# Patient Record
Sex: Female | Born: 1950
Health system: Southern US, Community
[De-identification: ages and names within clinical notes are randomized; demographics above are authoritative.]

## PROBLEM LIST (undated history)

## (undated) DIAGNOSIS — I10 Essential (primary) hypertension: Secondary | ICD-10-CM

## (undated) DIAGNOSIS — E785 Hyperlipidemia, unspecified: Secondary | ICD-10-CM

---

## 2017-09-16 DIAGNOSIS — E559 Vitamin D deficiency, unspecified: Secondary | ICD-10-CM | POA: Diagnosis not present

## 2017-09-16 DIAGNOSIS — J302 Other seasonal allergic rhinitis: Secondary | ICD-10-CM | POA: Insufficient documentation

## 2017-09-16 DIAGNOSIS — J3089 Other allergic rhinitis: Secondary | ICD-10-CM | POA: Diagnosis not present

## 2017-09-16 DIAGNOSIS — I10 Essential (primary) hypertension: Secondary | ICD-10-CM | POA: Diagnosis not present

## 2017-09-16 DIAGNOSIS — F17200 Nicotine dependence, unspecified, uncomplicated: Secondary | ICD-10-CM | POA: Diagnosis not present

## 2017-09-16 DIAGNOSIS — L2089 Other atopic dermatitis: Secondary | ICD-10-CM | POA: Insufficient documentation

## 2017-09-16 DIAGNOSIS — M5416 Radiculopathy, lumbar region: Secondary | ICD-10-CM | POA: Insufficient documentation

## 2017-10-01 DIAGNOSIS — I10 Essential (primary) hypertension: Secondary | ICD-10-CM | POA: Diagnosis not present

## 2017-10-19 DIAGNOSIS — I10 Essential (primary) hypertension: Secondary | ICD-10-CM | POA: Diagnosis not present

## 2017-10-26 DIAGNOSIS — I10 Essential (primary) hypertension: Secondary | ICD-10-CM | POA: Diagnosis not present

## 2017-10-26 DIAGNOSIS — F17200 Nicotine dependence, unspecified, uncomplicated: Secondary | ICD-10-CM | POA: Diagnosis not present

## 2017-10-26 DIAGNOSIS — E7800 Pure hypercholesterolemia, unspecified: Secondary | ICD-10-CM | POA: Diagnosis not present

## 2017-10-31 DIAGNOSIS — E7800 Pure hypercholesterolemia, unspecified: Secondary | ICD-10-CM | POA: Insufficient documentation

## 2018-04-20 DIAGNOSIS — E7800 Pure hypercholesterolemia, unspecified: Secondary | ICD-10-CM | POA: Diagnosis not present

## 2018-04-27 DIAGNOSIS — I10 Essential (primary) hypertension: Secondary | ICD-10-CM | POA: Diagnosis not present

## 2018-04-27 DIAGNOSIS — E7800 Pure hypercholesterolemia, unspecified: Secondary | ICD-10-CM | POA: Diagnosis not present

## 2018-04-27 DIAGNOSIS — Z780 Asymptomatic menopausal state: Secondary | ICD-10-CM | POA: Diagnosis not present

## 2018-04-27 DIAGNOSIS — Z1231 Encounter for screening mammogram for malignant neoplasm of breast: Secondary | ICD-10-CM | POA: Diagnosis not present

## 2018-04-27 DIAGNOSIS — Z0000 Encounter for general adult medical examination without abnormal findings: Secondary | ICD-10-CM | POA: Diagnosis not present

## 2018-04-28 ENCOUNTER — Other Ambulatory Visit: Payer: Self-pay | Admitting: Internal Medicine

## 2018-04-28 DIAGNOSIS — Z1231 Encounter for screening mammogram for malignant neoplasm of breast: Secondary | ICD-10-CM

## 2018-05-05 DIAGNOSIS — Z780 Asymptomatic menopausal state: Secondary | ICD-10-CM | POA: Diagnosis not present

## 2018-05-17 ENCOUNTER — Ambulatory Visit
Admission: RE | Admit: 2018-05-17 | Discharge: 2018-05-17 | Disposition: A | Discharge status: Home / Self Care | Payer: PPO | Source: Ambulatory Visit | Attending: Internal Medicine | Admitting: Internal Medicine

## 2018-05-17 DIAGNOSIS — Z1231 Encounter for screening mammogram for malignant neoplasm of breast: Secondary | ICD-10-CM | POA: Diagnosis not present

## 2018-06-02 ENCOUNTER — Other Ambulatory Visit: Payer: Self-pay | Admitting: *Deleted

## 2018-06-02 ENCOUNTER — Inpatient Hospital Stay
Admission: RE | Admit: 2018-06-02 | Discharge: 2018-06-02 | Disposition: A | Discharge status: Home / Self Care | Payer: Self-pay | Source: Ambulatory Visit | Attending: *Deleted | Admitting: *Deleted

## 2018-06-02 DIAGNOSIS — Z9289 Personal history of other medical treatment: Secondary | ICD-10-CM

## 2018-07-07 DIAGNOSIS — H1032 Unspecified acute conjunctivitis, left eye: Secondary | ICD-10-CM | POA: Diagnosis not present

## 2018-08-01 DIAGNOSIS — I10 Essential (primary) hypertension: Secondary | ICD-10-CM | POA: Diagnosis not present

## 2018-08-01 DIAGNOSIS — L2089 Other atopic dermatitis: Secondary | ICD-10-CM | POA: Diagnosis not present

## 2018-11-02 DIAGNOSIS — I10 Essential (primary) hypertension: Secondary | ICD-10-CM | POA: Diagnosis not present

## 2018-11-02 DIAGNOSIS — E559 Vitamin D deficiency, unspecified: Secondary | ICD-10-CM | POA: Diagnosis not present

## 2018-12-25 DIAGNOSIS — J029 Acute pharyngitis, unspecified: Secondary | ICD-10-CM | POA: Diagnosis not present

## 2018-12-25 DIAGNOSIS — R6889 Other general symptoms and signs: Secondary | ICD-10-CM | POA: Diagnosis not present

## 2018-12-25 DIAGNOSIS — J0100 Acute maxillary sinusitis, unspecified: Secondary | ICD-10-CM | POA: Diagnosis not present

## 2019-04-25 DIAGNOSIS — I10 Essential (primary) hypertension: Secondary | ICD-10-CM | POA: Diagnosis not present

## 2019-04-25 DIAGNOSIS — E559 Vitamin D deficiency, unspecified: Secondary | ICD-10-CM | POA: Diagnosis not present

## 2019-05-02 DIAGNOSIS — Z7982 Long term (current) use of aspirin: Secondary | ICD-10-CM | POA: Diagnosis not present

## 2019-05-02 DIAGNOSIS — I10 Essential (primary) hypertension: Secondary | ICD-10-CM | POA: Diagnosis not present

## 2019-05-02 DIAGNOSIS — Z1211 Encounter for screening for malignant neoplasm of colon: Secondary | ICD-10-CM | POA: Diagnosis not present

## 2019-05-02 DIAGNOSIS — Z0000 Encounter for general adult medical examination without abnormal findings: Secondary | ICD-10-CM | POA: Diagnosis not present

## 2019-05-02 DIAGNOSIS — M5416 Radiculopathy, lumbar region: Secondary | ICD-10-CM | POA: Diagnosis not present

## 2019-05-02 DIAGNOSIS — Z1159 Encounter for screening for other viral diseases: Secondary | ICD-10-CM | POA: Diagnosis not present

## 2019-05-02 DIAGNOSIS — R739 Hyperglycemia, unspecified: Secondary | ICD-10-CM | POA: Diagnosis not present

## 2019-05-02 DIAGNOSIS — D72828 Other elevated white blood cell count: Secondary | ICD-10-CM | POA: Diagnosis not present

## 2019-05-02 DIAGNOSIS — E559 Vitamin D deficiency, unspecified: Secondary | ICD-10-CM | POA: Diagnosis not present

## 2019-05-02 DIAGNOSIS — E7800 Pure hypercholesterolemia, unspecified: Secondary | ICD-10-CM | POA: Diagnosis not present

## 2019-05-12 DIAGNOSIS — Z1211 Encounter for screening for malignant neoplasm of colon: Secondary | ICD-10-CM | POA: Diagnosis not present

## 2019-05-25 DIAGNOSIS — R195 Other fecal abnormalities: Secondary | ICD-10-CM | POA: Diagnosis not present

## 2019-06-09 ENCOUNTER — Other Ambulatory Visit: Payer: Self-pay

## 2019-06-09 ENCOUNTER — Other Ambulatory Visit
Admission: RE | Admit: 2019-06-09 | Discharge: 2019-06-09 | Disposition: A | Discharge status: Home / Self Care | Payer: PPO | Source: Ambulatory Visit | Attending: Internal Medicine | Admitting: Internal Medicine

## 2019-06-09 DIAGNOSIS — Z1159 Encounter for screening for other viral diseases: Secondary | ICD-10-CM | POA: Diagnosis not present

## 2019-06-10 LAB — NOVEL CORONAVIRUS, NAA (HOSP ORDER, SEND-OUT TO REF LAB; TAT 18-24 HRS): SARS-CoV-2, NAA: NOT DETECTED

## 2019-06-13 ENCOUNTER — Encounter: Payer: Self-pay | Admitting: *Deleted

## 2019-06-14 ENCOUNTER — Ambulatory Visit
Admission: RE | Admit: 2019-06-14 | Discharge: 2019-06-14 | Disposition: A | Discharge status: Home / Self Care | Payer: PPO | Attending: Internal Medicine | Admitting: Internal Medicine

## 2019-06-14 ENCOUNTER — Ambulatory Visit: Payer: PPO | Admitting: Anesthesiology

## 2019-06-14 ENCOUNTER — Encounter
Admission: RE | Disposition: A | Discharge status: Home / Self Care | Payer: Self-pay | Source: Home / Self Care | Attending: Internal Medicine

## 2019-06-14 ENCOUNTER — Encounter: Payer: Self-pay | Admitting: *Deleted

## 2019-06-14 DIAGNOSIS — R195 Other fecal abnormalities: Secondary | ICD-10-CM | POA: Diagnosis not present

## 2019-06-14 DIAGNOSIS — K269 Duodenal ulcer, unspecified as acute or chronic, without hemorrhage or perforation: Secondary | ICD-10-CM | POA: Diagnosis not present

## 2019-06-14 DIAGNOSIS — D127 Benign neoplasm of rectosigmoid junction: Secondary | ICD-10-CM | POA: Insufficient documentation

## 2019-06-14 DIAGNOSIS — K640 First degree hemorrhoids: Secondary | ICD-10-CM | POA: Diagnosis not present

## 2019-06-14 DIAGNOSIS — Z7982 Long term (current) use of aspirin: Secondary | ICD-10-CM | POA: Diagnosis not present

## 2019-06-14 DIAGNOSIS — D125 Benign neoplasm of sigmoid colon: Secondary | ICD-10-CM | POA: Insufficient documentation

## 2019-06-14 DIAGNOSIS — I10 Essential (primary) hypertension: Secondary | ICD-10-CM | POA: Diagnosis not present

## 2019-06-14 DIAGNOSIS — K921 Melena: Secondary | ICD-10-CM | POA: Insufficient documentation

## 2019-06-14 DIAGNOSIS — Z79899 Other long term (current) drug therapy: Secondary | ICD-10-CM | POA: Diagnosis not present

## 2019-06-14 DIAGNOSIS — K264 Chronic or unspecified duodenal ulcer with hemorrhage: Secondary | ICD-10-CM | POA: Diagnosis not present

## 2019-06-14 DIAGNOSIS — J449 Chronic obstructive pulmonary disease, unspecified: Secondary | ICD-10-CM | POA: Diagnosis not present

## 2019-06-14 DIAGNOSIS — K635 Polyp of colon: Secondary | ICD-10-CM | POA: Diagnosis not present

## 2019-06-14 DIAGNOSIS — K3189 Other diseases of stomach and duodenum: Secondary | ICD-10-CM | POA: Diagnosis not present

## 2019-06-14 DIAGNOSIS — K2970 Gastritis, unspecified, without bleeding: Secondary | ICD-10-CM | POA: Diagnosis not present

## 2019-06-14 DIAGNOSIS — K648 Other hemorrhoids: Secondary | ICD-10-CM | POA: Diagnosis not present

## 2019-06-14 DIAGNOSIS — E785 Hyperlipidemia, unspecified: Secondary | ICD-10-CM | POA: Insufficient documentation

## 2019-06-14 DIAGNOSIS — K319 Disease of stomach and duodenum, unspecified: Secondary | ICD-10-CM | POA: Insufficient documentation

## 2019-06-14 DIAGNOSIS — F17200 Nicotine dependence, unspecified, uncomplicated: Secondary | ICD-10-CM | POA: Diagnosis not present

## 2019-06-14 DIAGNOSIS — D126 Benign neoplasm of colon, unspecified: Secondary | ICD-10-CM | POA: Diagnosis not present

## 2019-06-14 DIAGNOSIS — K2960 Other gastritis without bleeding: Secondary | ICD-10-CM | POA: Diagnosis not present

## 2019-06-14 DIAGNOSIS — D128 Benign neoplasm of rectum: Secondary | ICD-10-CM | POA: Diagnosis not present

## 2019-06-14 HISTORY — DX: Essential (primary) hypertension: I10

## 2019-06-14 HISTORY — PX: COLONOSCOPY WITH PROPOFOL: SHX5780

## 2019-06-14 HISTORY — DX: Hyperlipidemia, unspecified: E78.5

## 2019-06-14 HISTORY — PX: ESOPHAGOGASTRODUODENOSCOPY (EGD) WITH PROPOFOL: SHX5813

## 2019-06-14 SURGERY — COLONOSCOPY WITH PROPOFOL
Anesthesia: General

## 2019-06-14 MED ORDER — PROPOFOL 10 MG/ML IV BOLUS
INTRAVENOUS | Status: DC | PRN
  Administered 2019-06-14: 60 mg via INTRAVENOUS

## 2019-06-14 MED ORDER — PROPOFOL 10 MG/ML IV BOLUS
INTRAVENOUS | Status: AC
  Filled 2019-06-14: qty 20

## 2019-06-14 MED ORDER — PROPOFOL 500 MG/50ML IV EMUL
INTRAVENOUS | Status: AC
  Filled 2019-06-14: qty 50

## 2019-06-14 MED ORDER — EPINEPHRINE PF 1 MG/ML IJ SOLN
INTRAMUSCULAR | Status: DC | PRN
  Administered 2019-06-14: 1 mL via SUBCUTANEOUS

## 2019-06-14 MED ORDER — SODIUM CHLORIDE 0.9 % IV SOLN
INTRAVENOUS | Status: DC
  Administered 2019-06-14: 09:00:00 via INTRAVENOUS

## 2019-06-14 MED ORDER — PROPOFOL 500 MG/50ML IV EMUL
INTRAVENOUS | Status: DC | PRN
  Administered 2019-06-14: 150 ug/kg/min via INTRAVENOUS

## 2019-06-14 MED ORDER — GLYCOPYRROLATE 0.2 MG/ML IJ SOLN
INTRAMUSCULAR | Status: DC | PRN
  Administered 2019-06-14: 0.1 mg via INTRAVENOUS

## 2019-06-14 NOTE — Transfer of Care (Signed)
Immediate Anesthesia Transfer of Care Note  Patient: Brandy Holt  Procedure(s) Performed: COLONOSCOPY WITH PROPOFOL (N/A ) ESOPHAGOGASTRODUODENOSCOPY (EGD) WITH PROPOFOL (N/A )  Patient Location: PACU  Anesthesia Type:General  Level of Consciousness: drowsy  Airway & Oxygen Therapy: Patient Spontanous Breathing and Patient connected to nasal cannula oxygen  Post-op Assessment: Report given to RN and Post -op Vital signs reviewed and stable  Post vital signs: Reviewed and stable  Last Vitals:  Vitals Value Taken Time  BP 92/56 06/14/19 1028  Temp    Pulse 65 06/14/19 1029  Resp 14 06/14/19 1029  SpO2 100 % 06/14/19 1029  Vitals shown include unvalidated device data.  Last Pain:  Vitals:   06/14/19 1020  TempSrc: Tympanic  PainSc:          Complications: No apparent anesthesia complications

## 2019-06-14 NOTE — Anesthesia Preprocedure Evaluation (Signed)
Anesthesia Evaluation  Patient identified by MRN, date of birth, ID band Patient awake    Reviewed: Allergy & Precautions, H&P , NPO status , Patient's Chart, lab work & pertinent test results  History of Anesthesia Complications Negative for: history of anesthetic complications  Airway Mallampati: III  TM Distance: >3 FB Neck ROM: limited    Dental  (+) Chipped, Poor Dentition, Caps   Pulmonary COPD, Current Smoker,           Cardiovascular Exercise Tolerance: Good hypertension, (-) angina(-) Past MI and (-) DOE      Neuro/Psych negative neurological ROS  negative psych ROS   GI/Hepatic negative GI ROS, Neg liver ROS, neg GERD  ,  Endo/Other  negative endocrine ROS  Renal/GU negative Renal ROS  negative genitourinary   Musculoskeletal   Abdominal   Peds  Hematology negative hematology ROS (+)   Anesthesia Other Findings Past Medical History: No date: Hyperlipidemia No date: Hypertension  History reviewed. No pertinent surgical history.  BMI    Body Mass Index: 19.94 kg/m      Reproductive/Obstetrics negative OB ROS                             Anesthesia Physical Anesthesia Plan  ASA: III  Anesthesia Plan: General   Post-op Pain Management:    Induction: Intravenous  PONV Risk Score and Plan: Propofol infusion and TIVA  Airway Management Planned: Natural Airway and Nasal Cannula  Additional Equipment:   Intra-op Plan:   Post-operative Plan:   Informed Consent: I have reviewed the patients History and Physical, chart, labs and discussed the procedure including the risks, benefits and alternatives for the proposed anesthesia with the patient or authorized representative who has indicated his/her understanding and acceptance.     Dental Advisory Given  Plan Discussed with: Anesthesiologist, CRNA and Surgeon  Anesthesia Plan Comments: (Patient consented for risks  of anesthesia including but not limited to:  - adverse reactions to medications - risk of intubation if required - damage to teeth, lips or other oral mucosa - sore throat or hoarseness - Damage to heart, brain, lungs or loss of life  Patient voiced understanding.)        Anesthesia Quick Evaluation

## 2019-06-14 NOTE — Op Note (Signed)
[facility] Gastroenterology Patient Name: Brandy Holt Procedure Date: 06/14/2019 9:21 AM MRN: [id number] Account #: [id number] Date of Birth: 1951/06/29 Admit Type: Outpatient Age: 68 Room: [facility] ENDO ROOM 3 Gender: Female Note Status: Finalized Procedure:            Colonoscopy Indications:          Heme positive stool Providers:            Benay Pike. Jesse Hirst MD, MD Medicines:            Propofol per Anesthesia Complications:        No immediate complications. Procedure:            Pre-Anesthesia Assessment:                       - The risks and benefits of the procedure and the                        sedation options and risks were discussed with the                        patient. All questions were answered and informed                        consent was obtained.                       - Patient identification and proposed procedure were                        verified prior to the procedure by the nurse. The                        procedure was verified in the procedure room.                       - ASA Grade Assessment: III - A patient with severe                        systemic disease.                       - After reviewing the risks and benefits, the patient                        was deemed in satisfactory condition to undergo the                        procedure.                       After obtaining informed consent, the colonoscope was                        passed under direct vision. Throughout the procedure,                        the patient's blood pressure, pulse, and oxygen                        saturations were monitored continuously. The  Colonoscope was introduced through the anus and                        advanced to the the cecum, identified by appendiceal                        orifice and ileocecal valve. The colonoscopy was                        performed without difficulty. The patient tolerated the                  procedure well. The quality of the bowel preparation                        was excellent. The ileocecal valve, appendiceal                        orifice, and rectum were photographed. Findings:      The perianal and digital rectal examinations were normal. Pertinent       negatives include normal sphincter tone and no palpable rectal lesions.      Non-bleeding internal hemorrhoids were found during retroflexion. The       hemorrhoids were Grade I (internal hemorrhoids that do not prolapse).      A 5 mm polyp was found in the sigmoid colon. The polyp was sessile. The       polyp was removed with a hot snare. Resection and retrieval were       complete.      A 7 mm polyp was found in the recto-sigmoid colon. The polyp was       sessile. The polyp was removed with a hot snare. Resection and retrieval       were complete.      A 15 mm polyp was found in the recto-sigmoid colon. The polyp was       pedunculated. To prevent bleeding after the polypectomy, one hemostatic       clip was successfully placed (MR conditional). There was no bleeding       during, or at the end, of the procedure. The polyp was removed with a       hot snare. Resection and retrieval were complete. Impression:           - Non-bleeding internal hemorrhoids.                       - One 5 mm polyp in the sigmoid colon, removed with a                        hot snare. Resected and retrieved.                       - One 7 mm polyp at the recto-sigmoid colon, removed                        with a hot snare. Resected and retrieved.                       - One 15 mm polyp at the recto-sigmoid colon, removed  with a hot snare. Resected and retrieved. Clip (MR                        conditional) was placed. Recommendation:       - Await pathology results from EGD, also performed                        today.                       - PPI therapy with Pantoprazole 40mg  daily.                        - CBC in one day to monitor hgb in setting of ulcer                        bleed on EGD.                       - Await pathology results.                       - Repeat colonoscopy in 3 years for surveillance based                        on pathology results.                       - Return to physician assistant in 1 week.                       - The findings and recommendations were discussed with                        the patient. Procedure Code(s):    --- Professional ---                       [id number], Colonoscopy, flexible; with removal of tumor(s),                        polyp(s), or other lesion(s) by snare technique Diagnosis Code(s):    --- Professional ---                       R19.5, Other fecal abnormalities                       K64.0, First degree hemorrhoids                       K63.5, Polyp of colon CPT copyright 2019 American Medical Association. All rights reserved. The codes documented in this report are preliminary and upon coder review may  be revised to meet current compliance requirements. Efrain Sella MD, MD 06/14/2019 10:41:40 AM This report has been signed electronically. Number of Addenda: 0 Note Initiated On: 06/14/2019 9:21 AM Scope Withdrawal Time: 0 hours 12 minutes 57 seconds  Total Procedure Duration: 0 hours 19 minutes 37 seconds  Estimated Blood Loss: Estimated blood loss: none.      [facility]

## 2019-06-14 NOTE — H&P (Signed)
Outpatient short stay form Pre-procedure 06/14/2019 8:49 AM Brandy Holt K. [name], M.D.  Primary Physician: Ramonita Lab, M.D.  Reason for visit:  Hemoccult positive stool.  History of present illness:  Patient presents with hemoccult positive stool. Has occasional loose stools, but nothing chronic. Denies hematochezia, abdominal pain.    No current facility-administered medications for this encounter.   Medications Prior to Admission  Medication Sig Dispense Refill Last Dose  . amLODipine (NORVASC) 10 MG tablet Take 10 mg by mouth daily.     Marland Kitchen aspirin 325 MG tablet Take 325 mg by mouth daily.     . Calcium Carbonate-Vitamin D 600-400 MG-UNIT tablet Take 1 tablet by mouth daily.     Marland Kitchen lisinopril (ZESTRIL) 5 MG tablet Take 5 mg by mouth daily.     Marland Kitchen terbinafine (LAMISIL) 1 % cream Apply 1 application topically 2 (two) times daily.     Marland Kitchen triamcinolone cream (KENALOG) 0.1 % Apply 1 application topically 2 (two) times daily.        Not on File   Past Medical History:  Diagnosis Date  . Hyperlipidemia   . Hypertension     Review of systems:  Otherwise negative.    Physical Exam  Gen: Alert, oriented. Appears stated age.  HEENT: [place]/AT. PERRLA. Lungs: CTA, no wheezes. CV: RR nl S1, S2. Abd: soft, benign, no masses. BS+ Ext: No edema. Pulses 2+    Planned procedures: Proceed with colonoscopy. The patient understands the nature of the planned procedure, indications, risks, alternatives and potential complications including but not limited to bleeding, infection, perforation, damage to internal organs and possible oversedation/side effects from anesthesia. The patient agrees and gives consent to proceed.  Please refer to procedure notes for findings, recommendations and patient disposition/instructions.     Brandy Holt K. [name], M.D. Gastroenterology 06/14/2019  8:49 AM

## 2019-06-14 NOTE — Op Note (Signed)
[facility] Gastroenterology Patient Name: Brandy Holt Procedure Date: 06/14/2019 9:21 AM MRN: [id number] Account #: [id number] Date of Birth: 28-May-1951 Admit Type: Outpatient Age: 68 Room: [facility] ENDO ROOM 3 Gender: Female Note Status: Finalized Procedure:            Upper GI endoscopy Indications:          Heme positive stool Providers:            Benay Pike. Radin Raptis MD, MD Medicines:            Propofol per Anesthesia Complications:        No immediate complications. Procedure:            Pre-Anesthesia Assessment:                       - The risks and benefits of the procedure and the                        sedation options and risks were discussed with the                        patient. All questions were answered and informed                        consent was obtained.                       - Patient identification and proposed procedure were                        verified prior to the procedure by the nurse. The                        procedure was verified in the procedure room.                       - ASA Grade Assessment: III - A patient with severe                        systemic disease.                       - After reviewing the risks and benefits, the patient                        was deemed in satisfactory condition to undergo the                        procedure.                       After obtaining informed consent, the endoscope was                        passed under direct vision. Throughout the procedure,                        the patient's blood pressure, pulse, and oxygen                        saturations were monitored continuously. The Endoscope  was introduced through the mouth, and advanced to the                        third part of duodenum. The upper GI endoscopy was                        accomplished without difficulty. The patient tolerated                        the procedure well. Findings:  The examined esophagus was normal.      Localized mild inflammation characterized by erosions was found in the       gastric antrum. Biopsies were taken with a cold forceps for Helicobacter       pylori testing.      One non-obstructing oozing linear duodenal ulcer with a visible vessel       was found in the first portion of the duodenum. The lesion was 6 mm in       largest dimension. There is no evidence of perforation. Area was       unsuccessfully injected with 1 mL of a 1:10,000 solution of epinephrine       for hemostasis. Coagulation for hemostasis using bipolar probe was       successful.      The cardia and gastric fundus were normal on retroflexion.      The exam was otherwise without abnormality. Impression:           - Normal esophagus.                       - Gastritis. Biopsied.                       - Non-obstructing oozing duodenal ulcer with a visible                        vessel. There is no evidence of perforation. Treatment                        not successful. Treated with bipolar cautery.                       - The examination was otherwise normal. Recommendation:       - Await pathology results.                       - Proceed with colonoscopy Procedure Code(s):    --- Professional ---                       63845, 70, Esophagogastroduodenoscopy, flexible,                        transoral; with control of bleeding, any method                       43239, Esophagogastroduodenoscopy, flexible, transoral;                        with biopsy, single or multiple Diagnosis Code(s):    --- Professional ---  R19.5, Other fecal abnormalities                       K26.4, Chronic or unspecified duodenal ulcer with                        hemorrhage                       K29.70, Gastritis, unspecified, without bleeding CPT copyright 2019 American Medical Association. All rights reserved. The codes documented in this report are preliminary and upon coder  review may  be revised to meet current compliance requirements. Efrain Sella MD, MD 06/14/2019 10:01:04 AM This report has been signed electronically. Number of Addenda: 0 Note Initiated On: 06/14/2019 9:21 AM Estimated Blood Loss: Estimated blood loss was minimal.      [facility]

## 2019-06-14 NOTE — Anesthesia Postprocedure Evaluation (Signed)
Anesthesia Post Note  Patient: Brandy Holt  Procedure(s) Performed: COLONOSCOPY WITH PROPOFOL (N/A ) ESOPHAGOGASTRODUODENOSCOPY (EGD) WITH PROPOFOL (N/A )  Patient location during evaluation: Endoscopy Anesthesia Type: General Level of consciousness: awake and alert Pain management: pain level controlled Vital Signs Assessment: post-procedure vital signs reviewed and stable Respiratory status: spontaneous breathing, nonlabored ventilation, respiratory function stable and patient connected to nasal cannula oxygen Cardiovascular status: blood pressure returned to baseline and stable Postop Assessment: no apparent nausea or vomiting Anesthetic complications: no     Last Vitals:  Vitals:   06/14/19 1050 06/14/19 1100  BP: 103/73 97/72  Pulse: 61 (!) 53  Resp: (!) 21 16  Temp:    SpO2: 100% 100%    Last Pain:  Vitals:   06/14/19 1020  TempSrc: Tympanic  PainSc:                  [name]

## 2019-06-14 NOTE — Interval H&P Note (Signed)
History and Physical Interval Note:  06/14/2019 8:52 AM  Brandy Holt  has presented today for surgery, with the diagnosis of HEME + STOOL.  The various methods of treatment have been discussed with the patient and family. After consideration of risks, benefits and other options for treatment, the patient has consented to  Procedure(s): COLONOSCOPY WITH PROPOFOL (N/A) ESOPHAGOGASTRODUODENOSCOPY (EGD) WITH PROPOFOL (N/A) as a surgical intervention.  The patient's history has been reviewed, patient examined, no change in status, stable for surgery.  I have reviewed the patient's chart and labs.  Questions were answered to the patient's satisfaction.     [place], [place]

## 2019-06-14 NOTE — Anesthesia Post-op Follow-up Note (Signed)
Anesthesia QCDR form completed.        

## 2019-06-15 ENCOUNTER — Encounter: Payer: Self-pay | Admitting: Internal Medicine

## 2019-06-15 DIAGNOSIS — K274 Chronic or unspecified peptic ulcer, site unspecified, with hemorrhage: Secondary | ICD-10-CM | POA: Diagnosis not present

## 2019-06-15 LAB — SURGICAL PATHOLOGY

## 2019-06-21 DIAGNOSIS — K269 Duodenal ulcer, unspecified as acute or chronic, without hemorrhage or perforation: Secondary | ICD-10-CM | POA: Diagnosis not present

## 2019-06-21 DIAGNOSIS — M5416 Radiculopathy, lumbar region: Secondary | ICD-10-CM | POA: Diagnosis not present

## 2019-06-21 DIAGNOSIS — K2970 Gastritis, unspecified, without bleeding: Secondary | ICD-10-CM | POA: Diagnosis not present

## 2019-06-21 DIAGNOSIS — F17200 Nicotine dependence, unspecified, uncomplicated: Secondary | ICD-10-CM | POA: Diagnosis not present

## 2019-06-21 DIAGNOSIS — D369 Benign neoplasm, unspecified site: Secondary | ICD-10-CM | POA: Diagnosis not present

## 2019-06-21 DIAGNOSIS — I10 Essential (primary) hypertension: Secondary | ICD-10-CM | POA: Diagnosis not present

## 2019-10-26 DIAGNOSIS — E7800 Pure hypercholesterolemia, unspecified: Secondary | ICD-10-CM | POA: Diagnosis not present

## 2019-10-26 DIAGNOSIS — Z7982 Long term (current) use of aspirin: Secondary | ICD-10-CM | POA: Diagnosis not present

## 2019-10-26 DIAGNOSIS — E559 Vitamin D deficiency, unspecified: Secondary | ICD-10-CM | POA: Diagnosis not present

## 2019-10-26 DIAGNOSIS — I10 Essential (primary) hypertension: Secondary | ICD-10-CM | POA: Diagnosis not present

## 2019-10-26 DIAGNOSIS — R739 Hyperglycemia, unspecified: Secondary | ICD-10-CM | POA: Diagnosis not present

## 2019-10-26 DIAGNOSIS — M5416 Radiculopathy, lumbar region: Secondary | ICD-10-CM | POA: Diagnosis not present

## 2019-11-02 DIAGNOSIS — E7800 Pure hypercholesterolemia, unspecified: Secondary | ICD-10-CM | POA: Diagnosis not present

## 2019-11-02 DIAGNOSIS — I10 Essential (primary) hypertension: Secondary | ICD-10-CM | POA: Diagnosis not present

## 2019-11-02 DIAGNOSIS — M5416 Radiculopathy, lumbar region: Secondary | ICD-10-CM | POA: Diagnosis not present

## 2019-11-02 DIAGNOSIS — R739 Hyperglycemia, unspecified: Secondary | ICD-10-CM | POA: Diagnosis not present

## 2019-11-02 DIAGNOSIS — E559 Vitamin D deficiency, unspecified: Secondary | ICD-10-CM | POA: Diagnosis not present

## 2019-12-25 DIAGNOSIS — Z791 Long term (current) use of non-steroidal anti-inflammatories (NSAID): Secondary | ICD-10-CM | POA: Diagnosis not present

## 2019-12-25 DIAGNOSIS — K269 Duodenal ulcer, unspecified as acute or chronic, without hemorrhage or perforation: Secondary | ICD-10-CM | POA: Diagnosis not present

## 2019-12-25 DIAGNOSIS — D369 Benign neoplasm, unspecified site: Secondary | ICD-10-CM | POA: Diagnosis not present

## 2019-12-25 DIAGNOSIS — K2970 Gastritis, unspecified, without bleeding: Secondary | ICD-10-CM | POA: Diagnosis not present

## 2019-12-25 DIAGNOSIS — F17200 Nicotine dependence, unspecified, uncomplicated: Secondary | ICD-10-CM | POA: Diagnosis not present

## 2020-02-26 ENCOUNTER — Other Ambulatory Visit: Payer: Self-pay | Admitting: Internal Medicine

## 2020-02-26 DIAGNOSIS — Z1231 Encounter for screening mammogram for malignant neoplasm of breast: Secondary | ICD-10-CM

## 2020-04-09 ENCOUNTER — Ambulatory Visit
Admission: RE | Admit: 2020-04-09 | Discharge: 2020-04-09 | Disposition: A | Discharge status: Home / Self Care | Payer: PPO | Source: Ambulatory Visit | Attending: Internal Medicine | Admitting: Internal Medicine

## 2020-04-09 DIAGNOSIS — Z1231 Encounter for screening mammogram for malignant neoplasm of breast: Secondary | ICD-10-CM | POA: Insufficient documentation

## 2020-04-15 ENCOUNTER — Other Ambulatory Visit: Payer: Self-pay | Admitting: Internal Medicine

## 2020-04-15 DIAGNOSIS — N6489 Other specified disorders of breast: Secondary | ICD-10-CM

## 2020-04-15 DIAGNOSIS — R928 Other abnormal and inconclusive findings on diagnostic imaging of breast: Secondary | ICD-10-CM

## 2020-04-19 ENCOUNTER — Ambulatory Visit
Admission: RE | Admit: 2020-04-19 | Discharge: 2020-04-19 | Disposition: A | Discharge status: Home / Self Care | Payer: PPO | Source: Ambulatory Visit | Attending: Internal Medicine | Admitting: Internal Medicine

## 2020-04-19 DIAGNOSIS — N6489 Other specified disorders of breast: Secondary | ICD-10-CM

## 2020-04-19 DIAGNOSIS — R928 Other abnormal and inconclusive findings on diagnostic imaging of breast: Secondary | ICD-10-CM

## 2020-04-19 DIAGNOSIS — R922 Inconclusive mammogram: Secondary | ICD-10-CM | POA: Diagnosis not present

## 2020-04-25 DIAGNOSIS — I10 Essential (primary) hypertension: Secondary | ICD-10-CM | POA: Diagnosis not present

## 2020-04-25 DIAGNOSIS — E7800 Pure hypercholesterolemia, unspecified: Secondary | ICD-10-CM | POA: Diagnosis not present

## 2020-04-25 DIAGNOSIS — E559 Vitamin D deficiency, unspecified: Secondary | ICD-10-CM | POA: Diagnosis not present

## 2020-04-25 DIAGNOSIS — R739 Hyperglycemia, unspecified: Secondary | ICD-10-CM | POA: Diagnosis not present

## 2020-04-25 DIAGNOSIS — M5416 Radiculopathy, lumbar region: Secondary | ICD-10-CM | POA: Diagnosis not present

## 2020-05-02 DIAGNOSIS — Z7982 Long term (current) use of aspirin: Secondary | ICD-10-CM | POA: Diagnosis not present

## 2020-05-02 DIAGNOSIS — M5416 Radiculopathy, lumbar region: Secondary | ICD-10-CM | POA: Diagnosis not present

## 2020-05-02 DIAGNOSIS — E559 Vitamin D deficiency, unspecified: Secondary | ICD-10-CM | POA: Diagnosis not present

## 2020-05-02 DIAGNOSIS — E7800 Pure hypercholesterolemia, unspecified: Secondary | ICD-10-CM | POA: Diagnosis not present

## 2020-05-02 DIAGNOSIS — I10 Essential (primary) hypertension: Secondary | ICD-10-CM | POA: Diagnosis not present

## 2020-05-02 DIAGNOSIS — Z0000 Encounter for general adult medical examination without abnormal findings: Secondary | ICD-10-CM | POA: Diagnosis not present

## 2020-06-04 DIAGNOSIS — E7800 Pure hypercholesterolemia, unspecified: Secondary | ICD-10-CM | POA: Diagnosis not present

## 2020-09-03 DIAGNOSIS — R21 Rash and other nonspecific skin eruption: Secondary | ICD-10-CM | POA: Diagnosis not present

## 2020-09-16 DIAGNOSIS — D2261 Melanocytic nevi of right upper limb, including shoulder: Secondary | ICD-10-CM | POA: Diagnosis not present

## 2020-09-16 DIAGNOSIS — L270 Generalized skin eruption due to drugs and medicaments taken internally: Secondary | ICD-10-CM | POA: Diagnosis not present

## 2020-09-16 DIAGNOSIS — L309 Dermatitis, unspecified: Secondary | ICD-10-CM | POA: Diagnosis not present

## 2020-09-16 DIAGNOSIS — D225 Melanocytic nevi of trunk: Secondary | ICD-10-CM | POA: Diagnosis not present

## 2020-09-16 DIAGNOSIS — L821 Other seborrheic keratosis: Secondary | ICD-10-CM | POA: Diagnosis not present

## 2020-09-16 DIAGNOSIS — D2272 Melanocytic nevi of left lower limb, including hip: Secondary | ICD-10-CM | POA: Diagnosis not present

## 2020-09-16 DIAGNOSIS — D2271 Melanocytic nevi of right lower limb, including hip: Secondary | ICD-10-CM | POA: Diagnosis not present

## 2020-09-16 DIAGNOSIS — D2262 Melanocytic nevi of left upper limb, including shoulder: Secondary | ICD-10-CM | POA: Diagnosis not present

## 2020-10-29 DIAGNOSIS — I10 Essential (primary) hypertension: Secondary | ICD-10-CM | POA: Diagnosis not present

## 2020-10-29 DIAGNOSIS — E7800 Pure hypercholesterolemia, unspecified: Secondary | ICD-10-CM | POA: Diagnosis not present

## 2020-10-29 DIAGNOSIS — M5416 Radiculopathy, lumbar region: Secondary | ICD-10-CM | POA: Diagnosis not present

## 2020-10-29 DIAGNOSIS — Z7982 Long term (current) use of aspirin: Secondary | ICD-10-CM | POA: Diagnosis not present

## 2020-11-05 DIAGNOSIS — E7800 Pure hypercholesterolemia, unspecified: Secondary | ICD-10-CM | POA: Diagnosis not present

## 2020-11-05 DIAGNOSIS — E559 Vitamin D deficiency, unspecified: Secondary | ICD-10-CM | POA: Diagnosis not present

## 2020-11-05 DIAGNOSIS — I10 Essential (primary) hypertension: Secondary | ICD-10-CM | POA: Diagnosis not present

## 2020-11-05 DIAGNOSIS — M5416 Radiculopathy, lumbar region: Secondary | ICD-10-CM | POA: Diagnosis not present

## 2020-12-24 DIAGNOSIS — Z86010 Personal history of colonic polyps: Secondary | ICD-10-CM | POA: Diagnosis not present

## 2020-12-24 DIAGNOSIS — K2970 Gastritis, unspecified, without bleeding: Secondary | ICD-10-CM | POA: Diagnosis not present

## 2020-12-24 DIAGNOSIS — Z791 Long term (current) use of non-steroidal anti-inflammatories (NSAID): Secondary | ICD-10-CM | POA: Diagnosis not present

## 2020-12-24 DIAGNOSIS — F17200 Nicotine dependence, unspecified, uncomplicated: Secondary | ICD-10-CM | POA: Diagnosis not present

## 2020-12-24 DIAGNOSIS — K269 Duodenal ulcer, unspecified as acute or chronic, without hemorrhage or perforation: Secondary | ICD-10-CM | POA: Diagnosis not present

## 2021-04-28 DIAGNOSIS — I10 Essential (primary) hypertension: Secondary | ICD-10-CM | POA: Diagnosis not present

## 2021-04-28 DIAGNOSIS — E7800 Pure hypercholesterolemia, unspecified: Secondary | ICD-10-CM | POA: Diagnosis not present

## 2021-04-28 DIAGNOSIS — M5416 Radiculopathy, lumbar region: Secondary | ICD-10-CM | POA: Diagnosis not present

## 2021-05-05 DIAGNOSIS — E7800 Pure hypercholesterolemia, unspecified: Secondary | ICD-10-CM | POA: Diagnosis not present

## 2021-05-05 DIAGNOSIS — E559 Vitamin D deficiency, unspecified: Secondary | ICD-10-CM | POA: Diagnosis not present

## 2021-05-05 DIAGNOSIS — Z0000 Encounter for general adult medical examination without abnormal findings: Secondary | ICD-10-CM | POA: Diagnosis not present

## 2021-05-05 DIAGNOSIS — M5416 Radiculopathy, lumbar region: Secondary | ICD-10-CM | POA: Diagnosis not present

## 2021-05-05 DIAGNOSIS — F17200 Nicotine dependence, unspecified, uncomplicated: Secondary | ICD-10-CM | POA: Diagnosis not present

## 2021-05-05 DIAGNOSIS — I10 Essential (primary) hypertension: Secondary | ICD-10-CM | POA: Diagnosis not present

## 2021-09-11 DIAGNOSIS — Z135 Encounter for screening for eye and ear disorders: Secondary | ICD-10-CM | POA: Diagnosis not present

## 2021-09-11 DIAGNOSIS — H524 Presbyopia: Secondary | ICD-10-CM | POA: Diagnosis not present

## 2021-09-11 DIAGNOSIS — H5213 Myopia, bilateral: Secondary | ICD-10-CM | POA: Diagnosis not present

## 2021-09-11 DIAGNOSIS — H2513 Age-related nuclear cataract, bilateral: Secondary | ICD-10-CM | POA: Diagnosis not present

## 2021-09-11 DIAGNOSIS — H52223 Regular astigmatism, bilateral: Secondary | ICD-10-CM | POA: Diagnosis not present

## 2021-10-29 DIAGNOSIS — E7800 Pure hypercholesterolemia, unspecified: Secondary | ICD-10-CM | POA: Diagnosis not present

## 2021-10-29 DIAGNOSIS — M5416 Radiculopathy, lumbar region: Secondary | ICD-10-CM | POA: Diagnosis not present

## 2021-10-29 DIAGNOSIS — I10 Essential (primary) hypertension: Secondary | ICD-10-CM | POA: Diagnosis not present

## 2021-11-05 DIAGNOSIS — E559 Vitamin D deficiency, unspecified: Secondary | ICD-10-CM | POA: Diagnosis not present

## 2021-11-05 DIAGNOSIS — R739 Hyperglycemia, unspecified: Secondary | ICD-10-CM | POA: Diagnosis not present

## 2021-11-05 DIAGNOSIS — E7800 Pure hypercholesterolemia, unspecified: Secondary | ICD-10-CM | POA: Diagnosis not present

## 2021-11-05 DIAGNOSIS — M5416 Radiculopathy, lumbar region: Secondary | ICD-10-CM | POA: Diagnosis not present

## 2021-11-05 DIAGNOSIS — I10 Essential (primary) hypertension: Secondary | ICD-10-CM | POA: Diagnosis not present

## 2022-03-10 DIAGNOSIS — H5213 Myopia, bilateral: Secondary | ICD-10-CM | POA: Diagnosis not present

## 2022-03-10 DIAGNOSIS — H524 Presbyopia: Secondary | ICD-10-CM | POA: Diagnosis not present

## 2022-03-10 DIAGNOSIS — H2513 Age-related nuclear cataract, bilateral: Secondary | ICD-10-CM | POA: Diagnosis not present

## 2022-03-10 DIAGNOSIS — H52223 Regular astigmatism, bilateral: Secondary | ICD-10-CM | POA: Diagnosis not present

## 2022-03-11 ENCOUNTER — Other Ambulatory Visit: Payer: Self-pay | Admitting: Internal Medicine

## 2022-03-11 DIAGNOSIS — Z1231 Encounter for screening mammogram for malignant neoplasm of breast: Secondary | ICD-10-CM

## 2022-04-17 ENCOUNTER — Ambulatory Visit
Admission: RE | Admit: 2022-04-17 | Discharge: 2022-04-17 | Disposition: A | Discharge status: Home / Self Care | Payer: PPO | Source: Ambulatory Visit | Attending: Internal Medicine | Admitting: Internal Medicine

## 2022-04-17 DIAGNOSIS — Z1231 Encounter for screening mammogram for malignant neoplasm of breast: Secondary | ICD-10-CM | POA: Diagnosis not present

## 2022-04-20 ENCOUNTER — Other Ambulatory Visit: Payer: Self-pay | Admitting: Internal Medicine

## 2022-04-20 DIAGNOSIS — R928 Other abnormal and inconclusive findings on diagnostic imaging of breast: Secondary | ICD-10-CM

## 2022-04-20 DIAGNOSIS — N63 Unspecified lump in unspecified breast: Secondary | ICD-10-CM

## 2022-04-29 DIAGNOSIS — E7800 Pure hypercholesterolemia, unspecified: Secondary | ICD-10-CM | POA: Diagnosis not present

## 2022-04-29 DIAGNOSIS — I10 Essential (primary) hypertension: Secondary | ICD-10-CM | POA: Diagnosis not present

## 2022-04-29 DIAGNOSIS — R739 Hyperglycemia, unspecified: Secondary | ICD-10-CM | POA: Diagnosis not present

## 2022-04-29 DIAGNOSIS — M5416 Radiculopathy, lumbar region: Secondary | ICD-10-CM | POA: Diagnosis not present

## 2022-04-29 DIAGNOSIS — E559 Vitamin D deficiency, unspecified: Secondary | ICD-10-CM | POA: Diagnosis not present

## 2022-05-07 ENCOUNTER — Ambulatory Visit
Admission: RE | Admit: 2022-05-07 | Discharge: 2022-05-07 | Disposition: A | Discharge status: Home / Self Care | Payer: PPO | Source: Ambulatory Visit | Attending: Internal Medicine | Admitting: Internal Medicine

## 2022-05-07 DIAGNOSIS — N63 Unspecified lump in unspecified breast: Secondary | ICD-10-CM

## 2022-05-07 DIAGNOSIS — R928 Other abnormal and inconclusive findings on diagnostic imaging of breast: Secondary | ICD-10-CM | POA: Insufficient documentation

## 2022-05-07 DIAGNOSIS — N6001 Solitary cyst of right breast: Secondary | ICD-10-CM | POA: Diagnosis not present

## 2022-05-07 DIAGNOSIS — R922 Inconclusive mammogram: Secondary | ICD-10-CM | POA: Diagnosis not present

## 2022-05-08 DIAGNOSIS — D72820 Lymphocytosis (symptomatic): Secondary | ICD-10-CM | POA: Diagnosis not present

## 2022-05-08 DIAGNOSIS — M5416 Radiculopathy, lumbar region: Secondary | ICD-10-CM | POA: Diagnosis not present

## 2022-05-08 DIAGNOSIS — F17210 Nicotine dependence, cigarettes, uncomplicated: Secondary | ICD-10-CM | POA: Diagnosis not present

## 2022-05-08 DIAGNOSIS — R0989 Other specified symptoms and signs involving the circulatory and respiratory systems: Secondary | ICD-10-CM | POA: Diagnosis not present

## 2022-05-08 DIAGNOSIS — Z0000 Encounter for general adult medical examination without abnormal findings: Secondary | ICD-10-CM | POA: Diagnosis not present

## 2022-05-08 DIAGNOSIS — E559 Vitamin D deficiency, unspecified: Secondary | ICD-10-CM | POA: Diagnosis not present

## 2022-05-08 DIAGNOSIS — I10 Essential (primary) hypertension: Secondary | ICD-10-CM | POA: Diagnosis not present

## 2022-05-08 DIAGNOSIS — E7800 Pure hypercholesterolemia, unspecified: Secondary | ICD-10-CM | POA: Diagnosis not present

## 2022-06-24 DIAGNOSIS — I6523 Occlusion and stenosis of bilateral carotid arteries: Secondary | ICD-10-CM | POA: Diagnosis not present

## 2022-06-24 DIAGNOSIS — R0989 Other specified symptoms and signs involving the circulatory and respiratory systems: Secondary | ICD-10-CM | POA: Diagnosis not present

## 2022-06-24 DIAGNOSIS — I6502 Occlusion and stenosis of left vertebral artery: Secondary | ICD-10-CM | POA: Diagnosis not present

## 2022-06-24 DIAGNOSIS — I771 Stricture of artery: Secondary | ICD-10-CM | POA: Diagnosis not present

## 2022-06-25 DIAGNOSIS — I6502 Occlusion and stenosis of left vertebral artery: Secondary | ICD-10-CM | POA: Insufficient documentation

## 2022-08-11 ENCOUNTER — Ambulatory Visit (INDEPENDENT_AMBULATORY_CARE_PROVIDER_SITE_OTHER): Payer: PPO | Admitting: Nurse Practitioner

## 2022-08-11 ENCOUNTER — Encounter (INDEPENDENT_AMBULATORY_CARE_PROVIDER_SITE_OTHER): Payer: PPO | Admitting: Nurse Practitioner

## 2022-08-11 ENCOUNTER — Encounter (INDEPENDENT_AMBULATORY_CARE_PROVIDER_SITE_OTHER): Payer: Self-pay | Admitting: Nurse Practitioner

## 2022-08-11 VITALS — BP 110/74 | HR 108 | Resp 18 | Ht 62.0 in | Wt 107.0 lb

## 2022-08-11 DIAGNOSIS — I10 Essential (primary) hypertension: Secondary | ICD-10-CM | POA: Diagnosis not present

## 2022-08-11 DIAGNOSIS — F17200 Nicotine dependence, unspecified, uncomplicated: Secondary | ICD-10-CM | POA: Diagnosis not present

## 2022-08-11 DIAGNOSIS — I6502 Occlusion and stenosis of left vertebral artery: Secondary | ICD-10-CM

## 2022-08-11 DIAGNOSIS — E7800 Pure hypercholesterolemia, unspecified: Secondary | ICD-10-CM | POA: Diagnosis not present

## 2022-08-11 MED ORDER — ROSUVASTATIN CALCIUM 5 MG PO TABS
5.0000 mg | ORAL_TABLET | ORAL | 5 refills | Status: DC
Start: 2022-08-12 — End: 2023-08-30

## 2022-08-30 ENCOUNTER — Encounter (INDEPENDENT_AMBULATORY_CARE_PROVIDER_SITE_OTHER): Payer: Self-pay | Admitting: Nurse Practitioner

## 2022-08-30 NOTE — Progress Notes (Signed)
Subjective:    Patient ID: Brandy Holt, female    DOB: Apr 08, 1951, 71 y.o.   MRN: [id number] Chief Complaint  Patient presents with   New Patient (Initial Visit)    Consult for left vertebral artery stenosis    The patient is a 71 year old female who presents today as a referral from her PCP Dr. Caryl Comes in regards to an abnormal carotid duplex.  She initially had a carotid bruit auscultated on exam.  The patient was sent for carotid artery duplex which showed some early evidence of left vertebral artery stenosis.  Patient currently denies any dizziness.  She denies any numbness tingling or pain with exercise or activity or use of her left upper extremity.  She denies any cold sensation in her left upper extremity.  The patient underwent blood pressure of 124/72 with a left of 106/70    Review of Systems  Neurological:  Negative for dizziness.  All other systems reviewed and are negative.      Objective:   Physical Exam Vitals reviewed.  HENT:     Head: Normocephalic.  Neck:     Vascular: Carotid bruit present.  Cardiovascular:     Rate and Rhythm: Normal rate and regular rhythm.     Pulses:          Radial pulses are 2+ on the right side and 2+ on the left side.  Pulmonary:     Effort: Pulmonary effort is normal.  Skin:    General: Skin is warm and dry.  Neurological:     Mental Status: She is alert and oriented to person, place, and time.  Psychiatric:        Mood and Affect: Mood normal.        Behavior: Behavior normal.        Thought Content: Thought content normal.        Judgment: Judgment normal.     BP 110/74 (BP Location: Left Arm)   Pulse (!) 108   Resp 18   Ht '5\' 2"'$  (1.575 m)   Wt 107 lb (48.5 kg)   BMI 19.57 kg/m   Past Medical History:  Diagnosis Date   Hyperlipidemia    Hypertension     Social History   Socioeconomic History   Marital status: Divorced    Spouse name: Not on file   Number of children: Not on file   Years of education:  Not on file   Highest education level: Not on file  Occupational History   Not on file  Tobacco Use   Smoking status: Every Day    Packs/day: 0.50    Types: Cigarettes   Smokeless tobacco: Never  Vaping Use   Vaping Use: Never used  Substance and Sexual Activity   Alcohol use: Never   Drug use: Never   Sexual activity: Not on file  Other Topics Concern   Not on file  Social History Narrative   Not on file   Social Determinants of Health   Financial Resource Strain: Not on file  Food Insecurity: Not on file  Transportation Needs: Not on file  Physical Activity: Not on file  Stress: Not on file  Social Connections: Not on file  Intimate Partner Violence: Not on file    Past Surgical History:  Procedure Laterality Date   COLONOSCOPY WITH PROPOFOL N/A 06/14/2019   Procedure: COLONOSCOPY WITH PROPOFOL;  Surgeon: Toledo, Benay Pike, MD;  Location: [facility] ENDOSCOPY;  Service: Gastroenterology;  Laterality: N/A;   ESOPHAGOGASTRODUODENOSCOPY (  EGD) WITH PROPOFOL N/A 06/14/2019   Procedure: ESOPHAGOGASTRODUODENOSCOPY (EGD) WITH PROPOFOL;  Surgeon: Toledo, Benay Pike, MD;  Location: [facility] ENDOSCOPY;  Service: Gastroenterology;  Laterality: N/A;    Family History  Adopted: Yes  Problem Relation Age of Onset   Breast cancer Neg Hx     Allergies  Allergen Reactions   Amoxicillin Rash        No data to display            CMP  No results found for: "NA", "K", "CL", "CO2", "GLUCOSE", "BUN", "CREATININE", "CALCIUM", "PROT", "ALBUMIN", "AST", "ALT", "ALKPHOS", "BILITOT", "GFRNONAA", "GFRAA"   No results found.     Assessment & Plan:   1. Vertebral artery stenosis, left The patient does have evidence of vertebral artery stenosis.  There is also evidence of some mild still however the patient is not symptomatic.  In order to optimize her medical regimen we will place the patient on milligrams of Crestor.  Patient will also continue with her daily aspirin.  The patient return  in 6 months to reevaluate studies.  2. Essential hypertension Continue antihypertensive medications as already ordered, these medications have been reviewed and there are no changes at this time.   3. Current smoker Smoking cessation was discussed, 3-10 minutes spent on this topic specifically   4. Pure hypercholesterolemia Continue statin as ordered and reviewed, no changes at this time    Current Outpatient Medications on File Prior to Visit  Medication Sig Dispense Refill   amLODipine (NORVASC) 10 MG tablet Take 10 mg by mouth daily.     aspirin 325 MG tablet Take 325 mg by mouth daily.     Calcium Carbonate-Vitamin D 600-400 MG-UNIT tablet Take 1 tablet by mouth daily.     DENTA 5000 PLUS 1.1 % CREA dental cream Take by mouth.     lisinopril (ZESTRIL) 5 MG tablet Take 5 mg by mouth daily.     omeprazole (PRILOSEC) 20 MG capsule Take by mouth.     terbinafine (LAMISIL) 1 % cream Apply 1 application topically 2 (two) times daily. (Patient not taking: Reported on 08/11/2022)     triamcinolone cream (KENALOG) 0.1 % Apply 1 application topically 2 (two) times daily. (Patient not taking: Reported on 08/11/2022)     No current facility-administered medications on file prior to visit.    There are no Patient Instructions on file for this visit. No follow-ups on file.   Kris Hartmann, NP

## 2022-09-07 DIAGNOSIS — D3132 Benign neoplasm of left choroid: Secondary | ICD-10-CM | POA: Diagnosis not present

## 2022-09-07 DIAGNOSIS — H5213 Myopia, bilateral: Secondary | ICD-10-CM | POA: Diagnosis not present

## 2022-09-07 DIAGNOSIS — H2513 Age-related nuclear cataract, bilateral: Secondary | ICD-10-CM | POA: Diagnosis not present

## 2022-09-07 DIAGNOSIS — H52223 Regular astigmatism, bilateral: Secondary | ICD-10-CM | POA: Diagnosis not present

## 2022-09-07 DIAGNOSIS — H524 Presbyopia: Secondary | ICD-10-CM | POA: Diagnosis not present

## 2022-11-04 DIAGNOSIS — D72820 Lymphocytosis (symptomatic): Secondary | ICD-10-CM | POA: Diagnosis not present

## 2022-11-04 DIAGNOSIS — I10 Essential (primary) hypertension: Secondary | ICD-10-CM | POA: Diagnosis not present

## 2022-11-04 DIAGNOSIS — E7800 Pure hypercholesterolemia, unspecified: Secondary | ICD-10-CM | POA: Diagnosis not present

## 2022-11-18 DIAGNOSIS — I10 Essential (primary) hypertension: Secondary | ICD-10-CM | POA: Diagnosis not present

## 2022-11-18 DIAGNOSIS — M5416 Radiculopathy, lumbar region: Secondary | ICD-10-CM | POA: Diagnosis not present

## 2022-11-18 DIAGNOSIS — E559 Vitamin D deficiency, unspecified: Secondary | ICD-10-CM | POA: Diagnosis not present

## 2022-11-18 DIAGNOSIS — I6502 Occlusion and stenosis of left vertebral artery: Secondary | ICD-10-CM | POA: Diagnosis not present

## 2022-11-18 DIAGNOSIS — E7800 Pure hypercholesterolemia, unspecified: Secondary | ICD-10-CM | POA: Diagnosis not present

## 2022-11-18 DIAGNOSIS — D72820 Lymphocytosis (symptomatic): Secondary | ICD-10-CM | POA: Diagnosis not present

## 2023-01-05 IMAGING — US US BREAST*R* LIMITED INC AXILLA
1 series · 6 of 6 positions shown · non-contrast
Comparison: Previous exam(s).

CLINICAL DATA: Screening recall for a possible right breast mass.

EXAM:
DIGITAL DIAGNOSTIC UNILATERAL RIGHT MAMMOGRAM WITH TOMOSYNTHESIS AND
CAD; ULTRASOUND RIGHT BREAST LIMITED
TECHNIQUE: Right digital diagnostic mammography and breast tomosynthesis was
performed. The images were evaluated with computer-aided detection.;
Targeted ultrasound examination of the right breast was performed

[Series 1: us breast*right* limited inc axilla · 0.06mm/px · 6 of 6 slices shown]
[im 1/6]
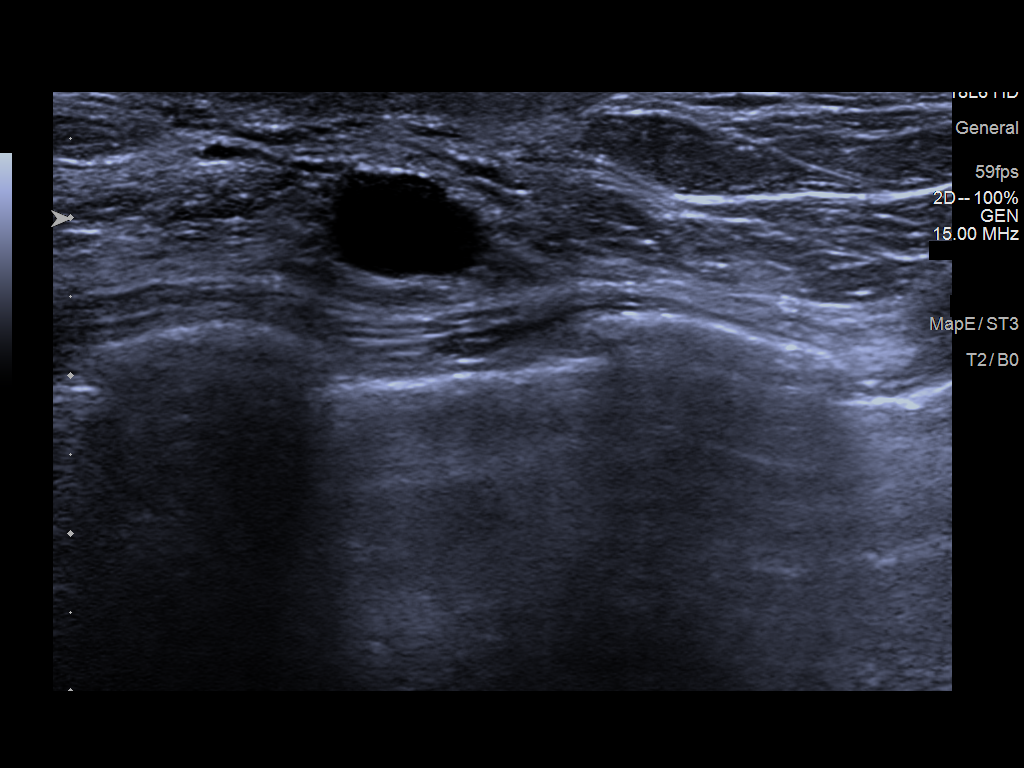
[im 2/6]
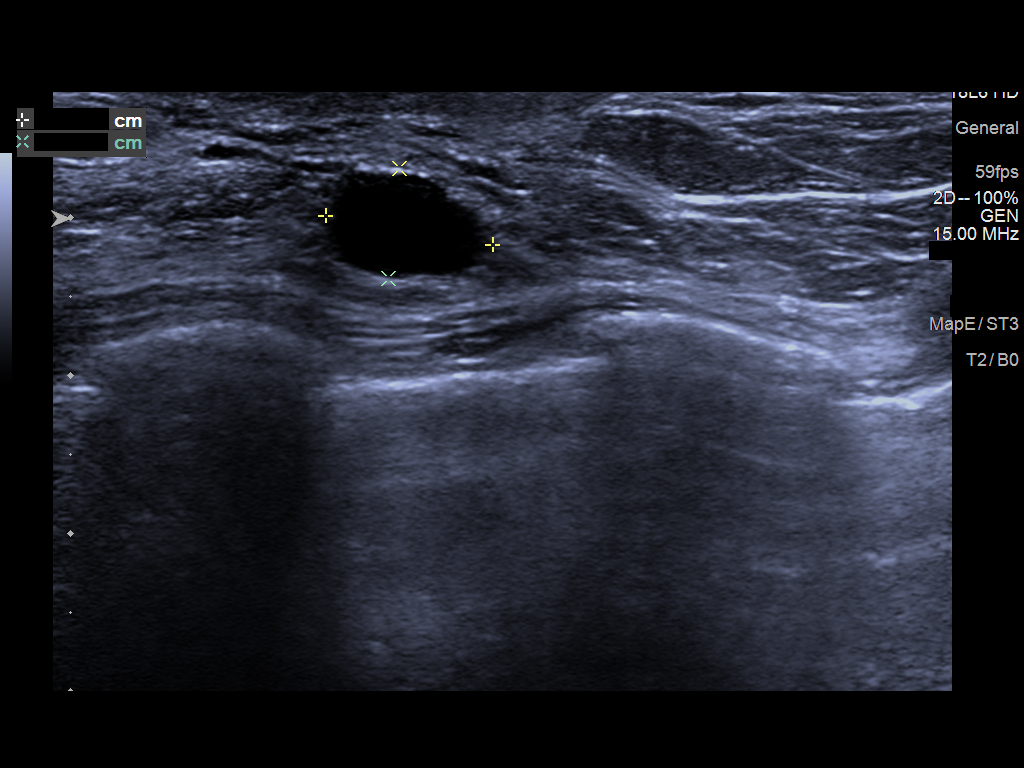
[im 3/6]
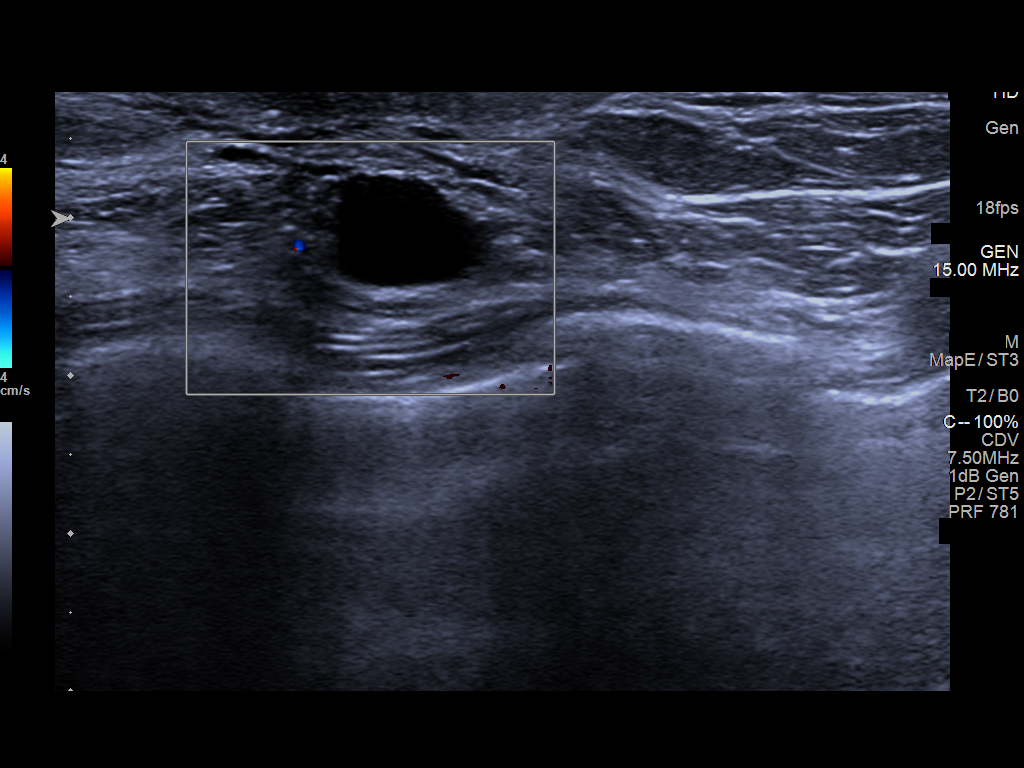
[im 4/6]
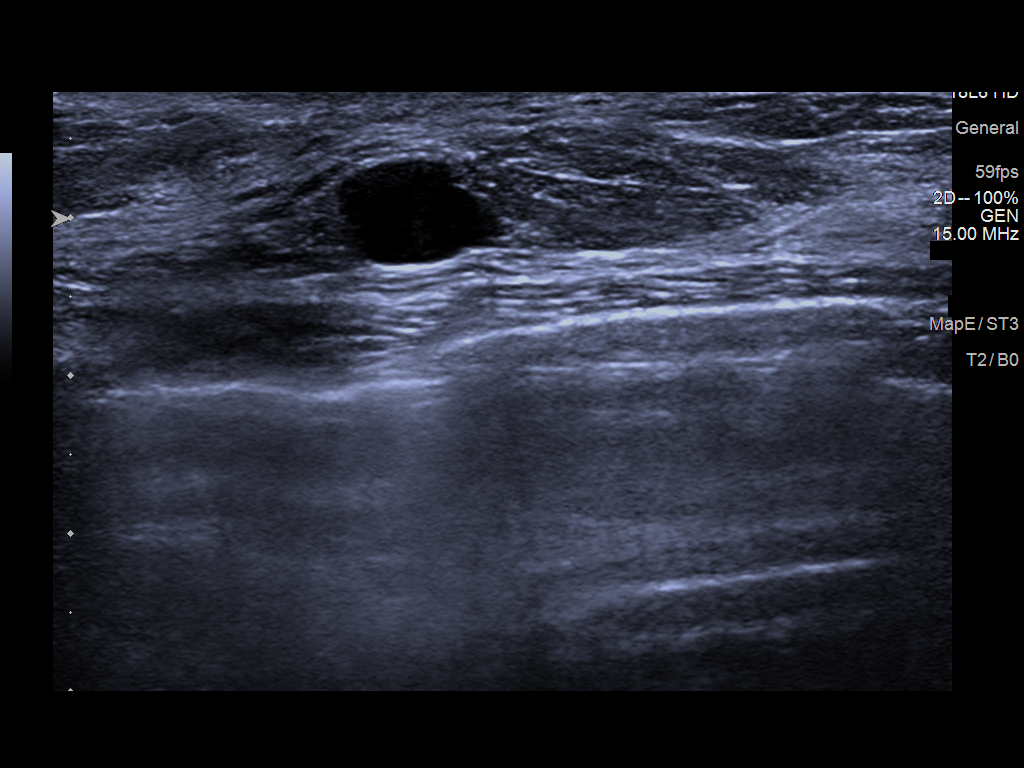
[im 5/6]
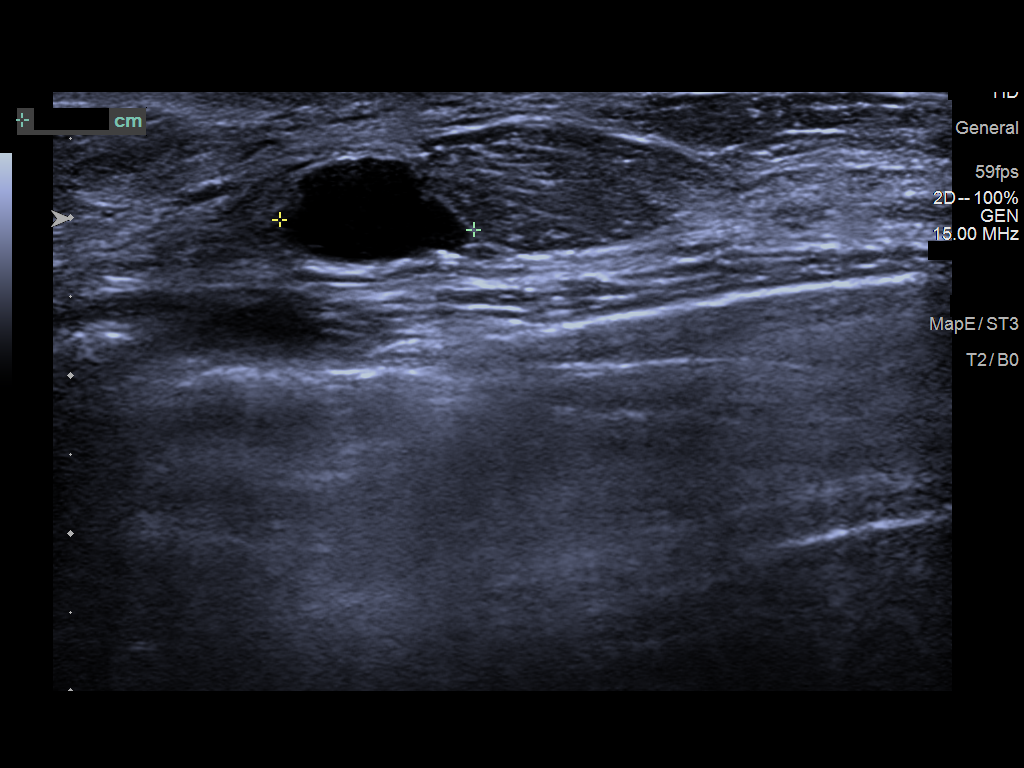
[im 6/6]
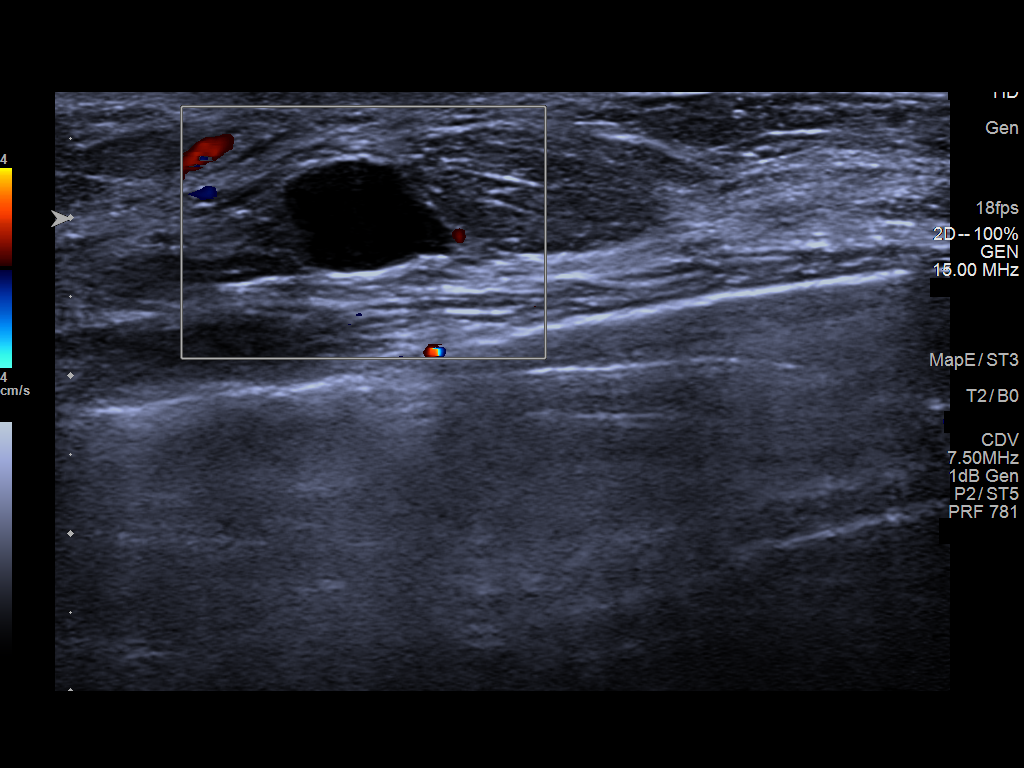

[6 of 6 positions shown; findings below may reference images not displayed]

ACR Breast Density Category c: The breast tissue is heterogeneously
dense, which may obscure small masses.
FINDINGS: Possible mass, noted in the retroareolar right breast on the current
screening exam, persists as a 12 mm, circumscribed oval mass.

Targeted right breast ultrasound is performed, showing simple cyst
at 4 o'clock, retroareolar in the right breast, measuring 1.2 x
x 1.1 cm, consistent in size, shape and location to the mammographic
mass. No solid masses or suspicious lesions.
IMPRESSION: 1. No evidence of breast malignancy.
2. Benign right breast cyst.

RECOMMENDATION:
Screening mammogram in one year.(Code:[id number])

I have discussed the findings and recommendations with the patient.
If applicable, a reminder letter will be sent to the patient
regarding the next appointment.

BI-RADS CATEGORY  2: Benign.

## 2023-01-05 IMAGING — MG MM DIGITAL DIAGNOSTIC UNILAT*R* W/ TOMO W/ CAD
4 series · 4 of 12 positions shown · non-contrast
Comparison: Previous exam(s).

CLINICAL DATA: Screening recall for a possible right breast mass.

EXAM:
DIGITAL DIAGNOSTIC UNILATERAL RIGHT MAMMOGRAM WITH TOMOSYNTHESIS AND
CAD; ULTRASOUND RIGHT BREAST LIMITED
TECHNIQUE: Right digital diagnostic mammography and breast tomosynthesis was
performed. The images were evaluated with computer-aided detection.;
Targeted ultrasound examination of the right breast was performed

[R MLO synth-2D]
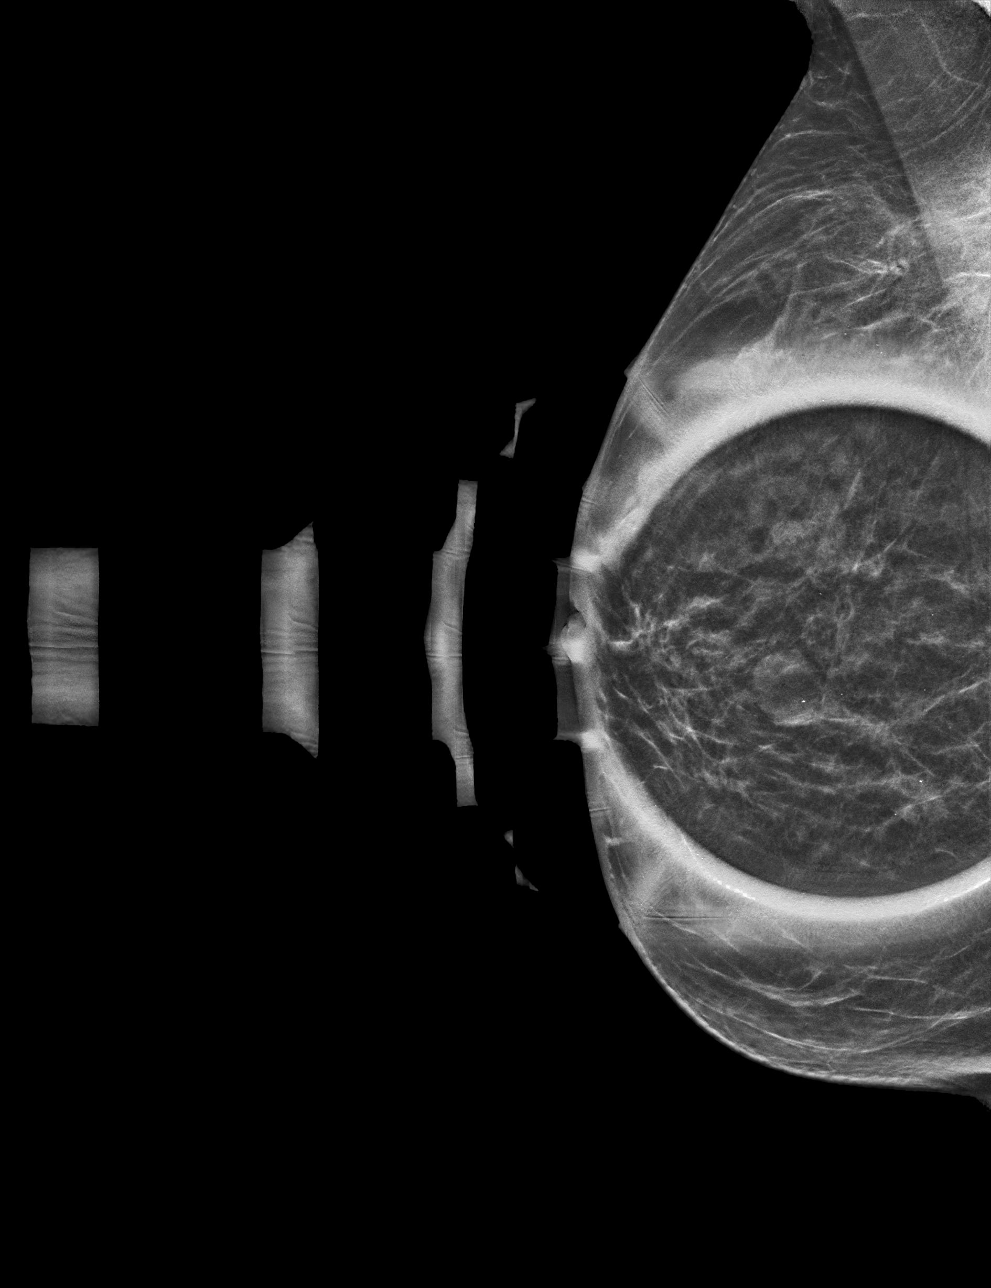

[R CC synth-2D]
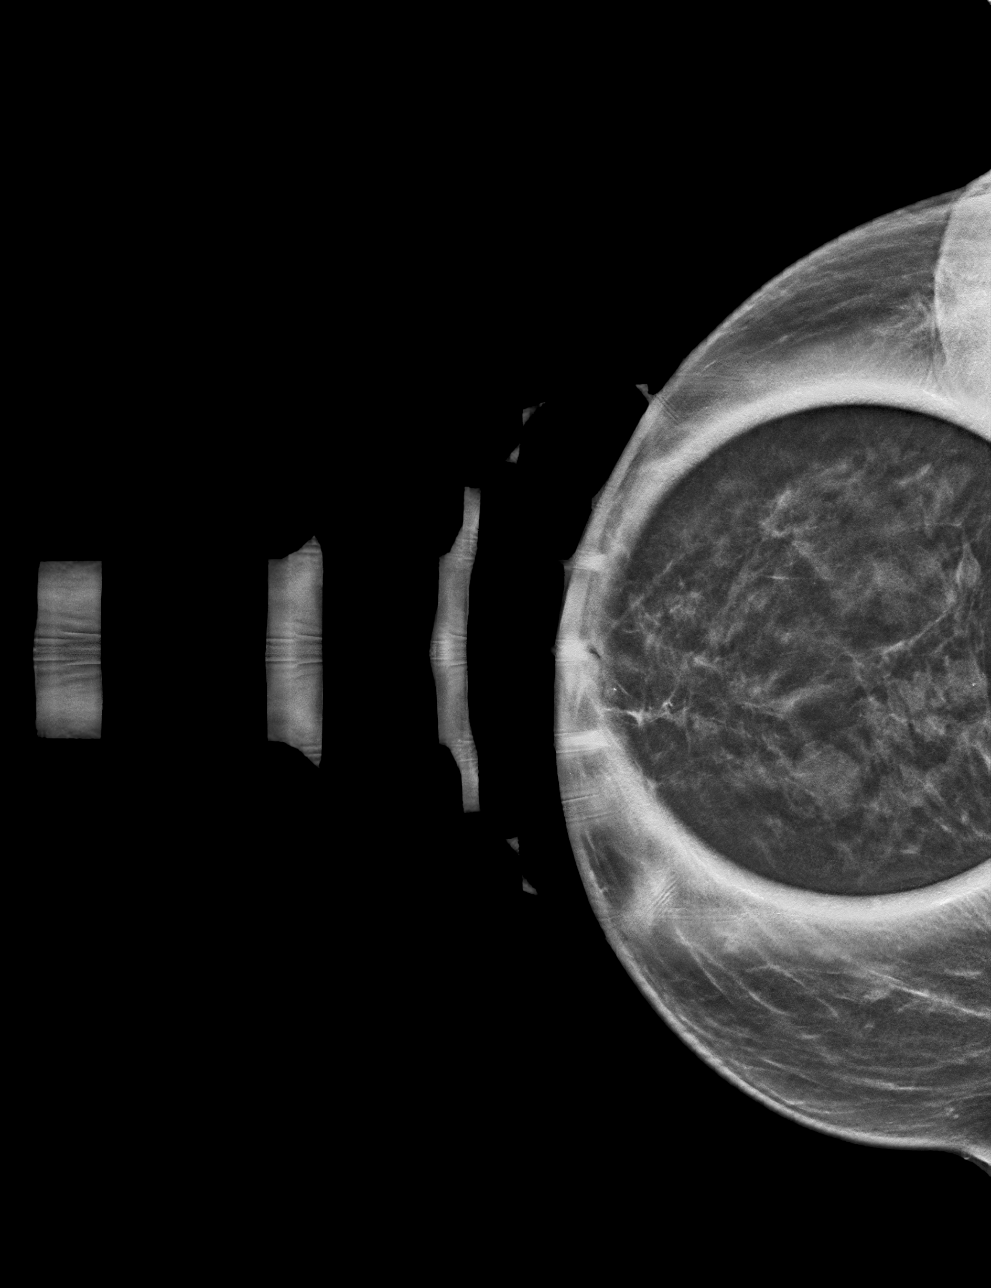

[R MLO tomo · tomo slice 22/43.0]
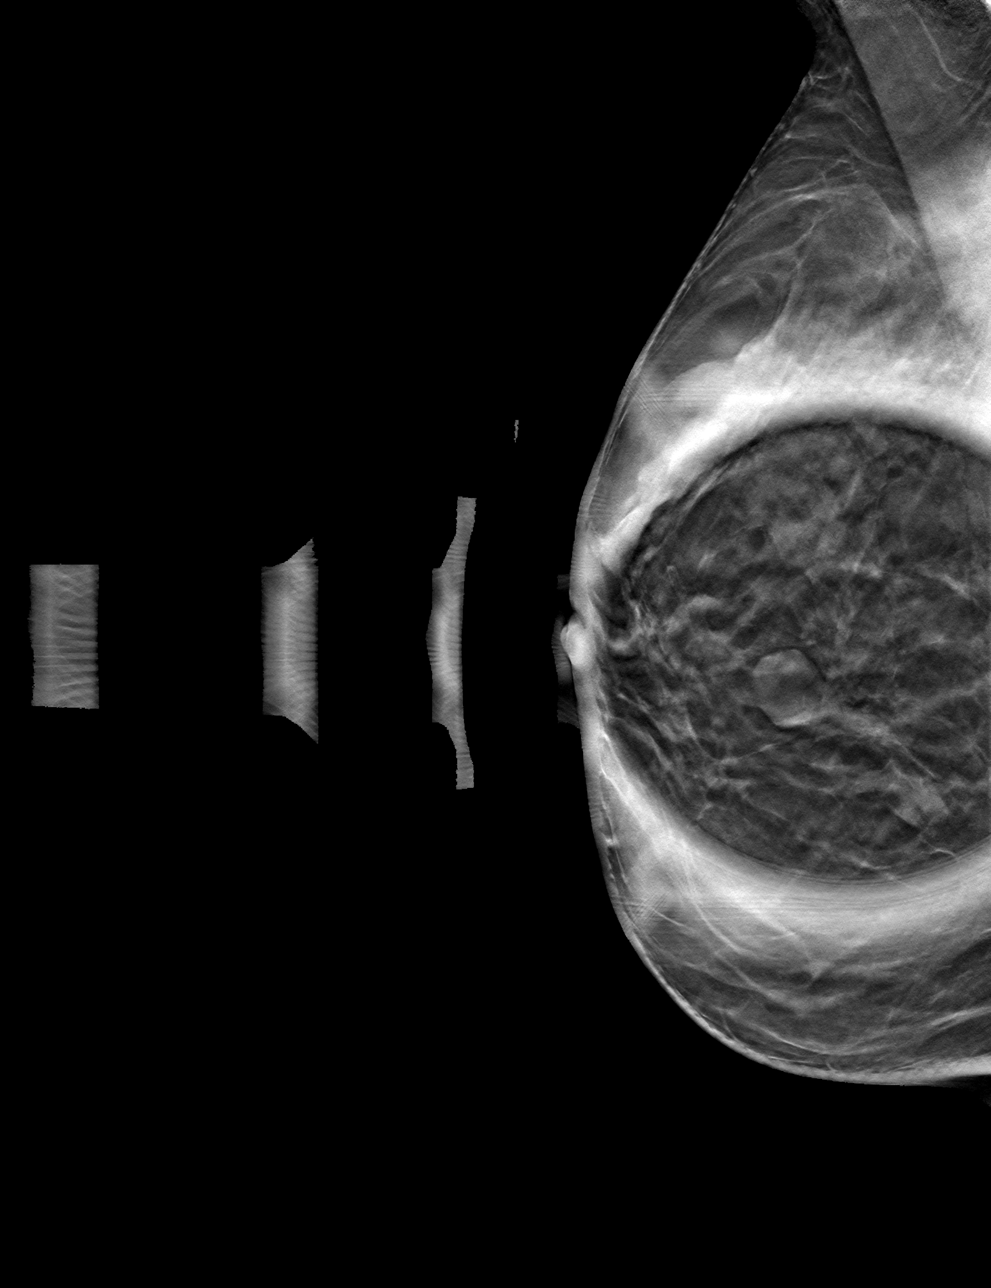

[R CC tomo · tomo slice 22/43.0]
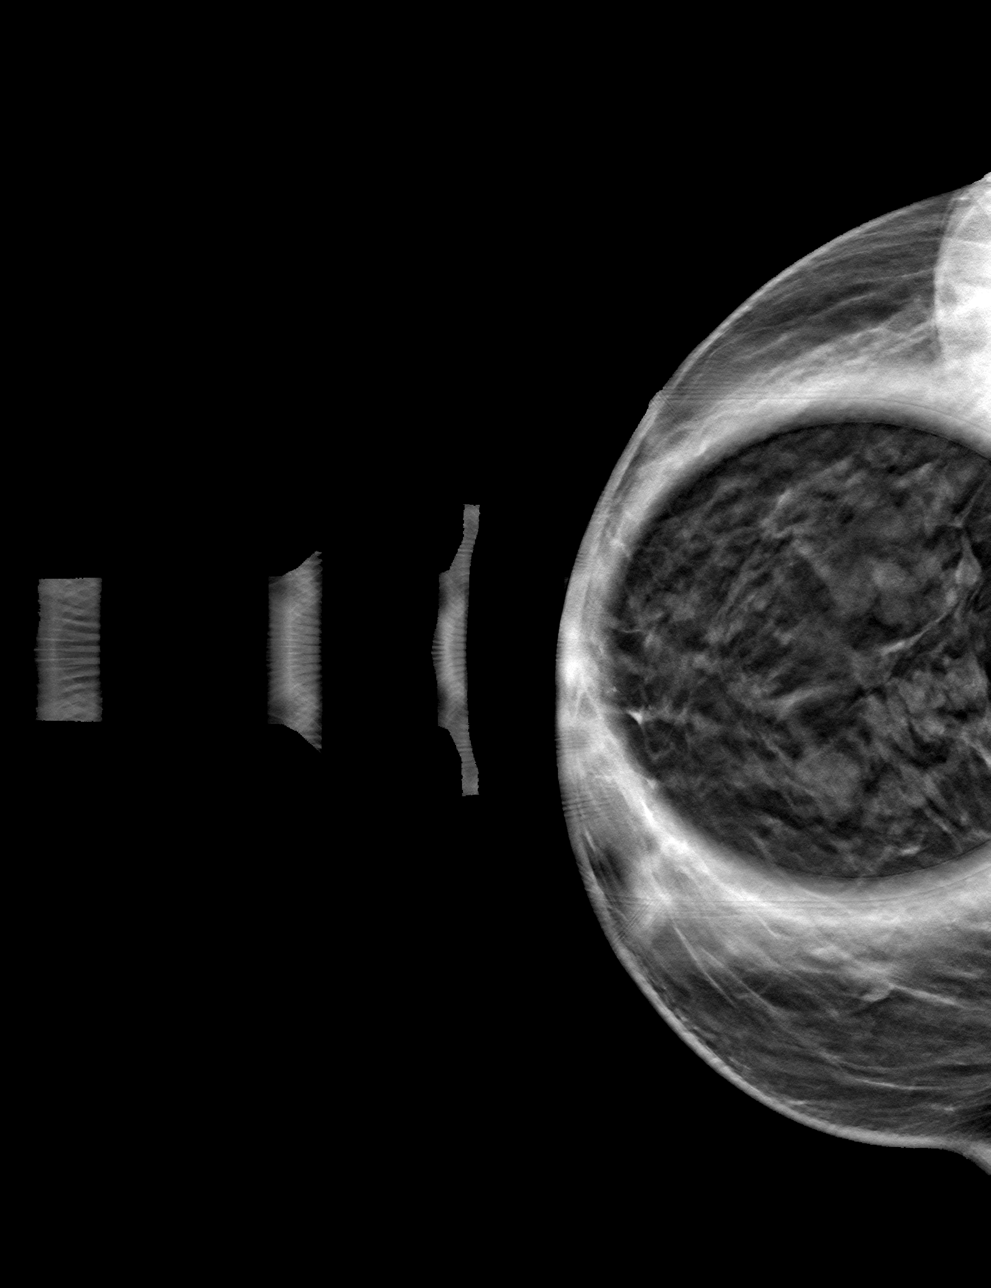

[4 of 12 positions shown; findings below may reference images not displayed]

ACR Breast Density Category c: The breast tissue is heterogeneously
dense, which may obscure small masses.
FINDINGS: Possible mass, noted in the retroareolar right breast on the current
screening exam, persists as a 12 mm, circumscribed oval mass.

Targeted right breast ultrasound is performed, showing simple cyst
at 4 o'clock, retroareolar in the right breast, measuring 1.2 x
x 1.1 cm, consistent in size, shape and location to the mammographic
mass. No solid masses or suspicious lesions.
IMPRESSION: 1. No evidence of breast malignancy.
2. Benign right breast cyst.

RECOMMENDATION:
Screening mammogram in one year.(Code:[id number])

I have discussed the findings and recommendations with the patient.
If applicable, a reminder letter will be sent to the patient
regarding the next appointment.

BI-RADS CATEGORY  2: Benign.

## 2023-02-10 ENCOUNTER — Other Ambulatory Visit (INDEPENDENT_AMBULATORY_CARE_PROVIDER_SITE_OTHER): Payer: Self-pay | Admitting: Nurse Practitioner

## 2023-02-10 DIAGNOSIS — I6502 Occlusion and stenosis of left vertebral artery: Secondary | ICD-10-CM

## 2023-02-11 ENCOUNTER — Encounter (INDEPENDENT_AMBULATORY_CARE_PROVIDER_SITE_OTHER): Payer: Self-pay | Admitting: Nurse Practitioner

## 2023-02-11 ENCOUNTER — Ambulatory Visit (INDEPENDENT_AMBULATORY_CARE_PROVIDER_SITE_OTHER): Payer: PPO | Admitting: Nurse Practitioner

## 2023-02-11 ENCOUNTER — Ambulatory Visit (INDEPENDENT_AMBULATORY_CARE_PROVIDER_SITE_OTHER): Payer: PPO

## 2023-02-11 VITALS — BP 126/73 | HR 84 | Resp 16 | Wt 111.0 lb

## 2023-02-11 DIAGNOSIS — G458 Other transient cerebral ischemic attacks and related syndromes: Secondary | ICD-10-CM

## 2023-02-11 DIAGNOSIS — F17200 Nicotine dependence, unspecified, uncomplicated: Secondary | ICD-10-CM | POA: Diagnosis not present

## 2023-02-11 DIAGNOSIS — I10 Essential (primary) hypertension: Secondary | ICD-10-CM

## 2023-02-11 DIAGNOSIS — E7800 Pure hypercholesterolemia, unspecified: Secondary | ICD-10-CM

## 2023-02-11 NOTE — Progress Notes (Signed)
Subjective:    Patient ID: Brandy Holt, female    DOB: 11/04/1951, 72 y.o.   MRN: [id number] Chief Complaint  Patient presents with   Follow-up    Ultrasound follow up    The patient is a 72 year old female who presents today for follow-up after detection of a carotid bruit.  She was found to have left subclavian steal based off of ultrasound duplex.  She initially had a carotid bruit auscultated on exam.  The patient was sent for carotid artery duplex which showed some early evidence of left vertebral artery stenosis.  Since her last visit, patient currently denies any dizziness.  She denies any numbness tingling or pain with exercise or activity or use of her left upper extremity.  She denies any cold sensation in her left upper extremity.  The patient had asymmetrical blood pressures.  Today the patient has 1 to 39% Karrar artery stenosis bilaterally.  There is antegrade flow in the right vertebral with bidirectional flow in the vertebrals.  The left subclavian artery is stenotic with normal flow hemodynamics noted in the right subclavian.    Review of Systems  Neurological:  Negative for dizziness.  All other systems reviewed and are negative.      Objective:   Physical Exam Vitals reviewed.  HENT:     Head: Normocephalic.  Neck:     Vascular: Carotid bruit present.  Cardiovascular:     Rate and Rhythm: Normal rate and regular rhythm.     Pulses:          Radial pulses are 2+ on the right side and 2+ on the left side.  Pulmonary:     Effort: Pulmonary effort is normal.  Skin:    General: Skin is warm and dry.  Neurological:     Mental Status: She is alert and oriented to person, place, and time.  Psychiatric:        Mood and Affect: Mood normal.        Behavior: Behavior normal.        Thought Content: Thought content normal.        Judgment: Judgment normal.     BP 126/73 (BP Location: Right Arm)   Pulse 84   Resp 16   Wt 111 lb (50.3 kg)   BMI 20.30 kg/m    Past Medical History:  Diagnosis Date   Hyperlipidemia    Hypertension     Social History   Socioeconomic History   Marital status: Divorced    Spouse name: Not on file   Number of children: Not on file   Years of education: Not on file   Highest education level: Not on file  Occupational History   Not on file  Tobacco Use   Smoking status: Every Day    Packs/day: 0.50    Types: Cigarettes   Smokeless tobacco: Never  Vaping Use   Vaping Use: Never used  Substance and Sexual Activity   Alcohol use: Never   Drug use: Never   Sexual activity: Not on file  Other Topics Concern   Not on file  Social History Narrative   Not on file   Social Determinants of Health   Financial Resource Strain: Not on file  Food Insecurity: Not on file  Transportation Needs: Not on file  Physical Activity: Not on file  Stress: Not on file  Social Connections: Not on file  Intimate Partner Violence: Not on file    Past Surgical History:  Procedure  Laterality Date   COLONOSCOPY WITH PROPOFOL N/A 06/14/2019   Procedure: COLONOSCOPY WITH PROPOFOL;  Surgeon: Toledo, Benay Pike, MD;  Location: [facility];  Service: Gastroenterology;  Laterality: N/A;   ESOPHAGOGASTRODUODENOSCOPY (EGD) WITH PROPOFOL N/A 06/14/2019   Procedure: ESOPHAGOGASTRODUODENOSCOPY (EGD) WITH PROPOFOL;  Surgeon: Toledo, Benay Pike, MD;  Location: [facility];  Service: Gastroenterology;  Laterality: N/A;    Family History  Adopted: Yes  Problem Relation Age of Onset   Breast cancer Neg Hx     Allergies  Allergen Reactions   Amoxicillin Rash        No data to display             CMP  No results found for: "NA", "K", "CL", "CO2", "GLUCOSE", "BUN", "CREATININE", "CALCIUM", "PROT", "ALBUMIN", "AST", "ALT", "ALKPHOS", "BILITOT", "GFRNONAA", "GFRAA"   No results found.     Assessment & Plan:    1. Subclavian steal syndrome The patient has evidence of subclavian artery stenosis with evidence of  subclavian steal.  However, the patient does not have any significant symptoms noted.  We discussed that the most common issues tend to be dizziness, numbness or discoloration and pain in the upper extremity.  Patient is advised that smoking cessation will be helpful for her atherosclerotic disease.  Patient remains with statin and aspirin. - VAS US CAROTID  2. Essential hypertension Continue antihypertensive medications as already ordered, these medications have been reviewed and there are no changes at this time.   3. Current smoker Smoking cessation was discussed, 3-10 minutes spent on this topic specifically   4. Pure hypercholesterolemia Continue statin as ordered and reviewed, no changes at this time     Current Outpatient Medications on File Prior to Visit  Medication Sig Dispense Refill   amLODipine (NORVASC) 10 MG tablet Take 10 mg by mouth daily.     aspirin 325 MG tablet Take 325 mg by mouth daily.     Calcium Carbonate-Vitamin D 600-400 MG-UNIT tablet Take 1 tablet by mouth daily.     DENTA 5000 PLUS 1.1 % CREA dental cream Take by mouth.     lisinopril (ZESTRIL) 2.5 MG tablet Take 2.5 mg by mouth daily.     omeprazole (PRILOSEC) 20 MG capsule Take by mouth.     rosuvastatin (CRESTOR) 5 MG tablet Take 1 tablet (5 mg total) by mouth 3 (three) times a week. 15 tablet 5   terbinafine (LAMISIL) 1 % cream Apply 1 application topically 2 (two) times daily. (Patient not taking: Reported on 08/11/2022)     triamcinolone cream (KENALOG) 0.1 % Apply 1 application topically 2 (two) times daily. (Patient not taking: Reported on 08/11/2022)     No current facility-administered medications on file prior to visit.    There are no Patient Instructions on file for this visit. No follow-ups on file.   Kris Hartmann, NP

## 2023-03-10 DIAGNOSIS — H2513 Age-related nuclear cataract, bilateral: Secondary | ICD-10-CM | POA: Diagnosis not present

## 2023-03-10 DIAGNOSIS — D3132 Benign neoplasm of left choroid: Secondary | ICD-10-CM | POA: Diagnosis not present

## 2023-03-10 DIAGNOSIS — H52223 Regular astigmatism, bilateral: Secondary | ICD-10-CM | POA: Diagnosis not present

## 2023-03-10 DIAGNOSIS — H524 Presbyopia: Secondary | ICD-10-CM | POA: Diagnosis not present

## 2023-03-10 DIAGNOSIS — H5213 Myopia, bilateral: Secondary | ICD-10-CM | POA: Diagnosis not present

## 2023-05-07 DIAGNOSIS — E7800 Pure hypercholesterolemia, unspecified: Secondary | ICD-10-CM | POA: Diagnosis not present

## 2023-05-07 DIAGNOSIS — D72820 Lymphocytosis (symptomatic): Secondary | ICD-10-CM | POA: Diagnosis not present

## 2023-05-07 DIAGNOSIS — I10 Essential (primary) hypertension: Secondary | ICD-10-CM | POA: Diagnosis not present

## 2023-05-07 DIAGNOSIS — E559 Vitamin D deficiency, unspecified: Secondary | ICD-10-CM | POA: Diagnosis not present

## 2023-05-14 DIAGNOSIS — Z0000 Encounter for general adult medical examination without abnormal findings: Secondary | ICD-10-CM | POA: Diagnosis not present

## 2023-05-14 DIAGNOSIS — E7800 Pure hypercholesterolemia, unspecified: Secondary | ICD-10-CM | POA: Diagnosis not present

## 2023-05-14 DIAGNOSIS — Z780 Asymptomatic menopausal state: Secondary | ICD-10-CM | POA: Diagnosis not present

## 2023-05-14 DIAGNOSIS — D7589 Other specified diseases of blood and blood-forming organs: Secondary | ICD-10-CM | POA: Diagnosis not present

## 2023-05-14 DIAGNOSIS — M5416 Radiculopathy, lumbar region: Secondary | ICD-10-CM | POA: Diagnosis not present

## 2023-05-14 DIAGNOSIS — E559 Vitamin D deficiency, unspecified: Secondary | ICD-10-CM | POA: Diagnosis not present

## 2023-05-14 DIAGNOSIS — R739 Hyperglycemia, unspecified: Secondary | ICD-10-CM | POA: Diagnosis not present

## 2023-05-14 DIAGNOSIS — I10 Essential (primary) hypertension: Secondary | ICD-10-CM | POA: Diagnosis not present

## 2023-05-14 DIAGNOSIS — F17210 Nicotine dependence, cigarettes, uncomplicated: Secondary | ICD-10-CM | POA: Diagnosis not present

## 2023-05-20 DIAGNOSIS — M810 Age-related osteoporosis without current pathological fracture: Secondary | ICD-10-CM | POA: Diagnosis not present

## 2023-08-30 ENCOUNTER — Other Ambulatory Visit (INDEPENDENT_AMBULATORY_CARE_PROVIDER_SITE_OTHER): Payer: Self-pay | Admitting: Nurse Practitioner

## 2023-08-30 MED ORDER — ROSUVASTATIN CALCIUM 5 MG PO TABS: 5.0000 mg | ORAL_TABLET | ORAL | 5 refills | Status: AC

## 2023-09-06 DIAGNOSIS — D3132 Benign neoplasm of left choroid: Secondary | ICD-10-CM | POA: Diagnosis not present

## 2023-09-06 DIAGNOSIS — H5213 Myopia, bilateral: Secondary | ICD-10-CM | POA: Diagnosis not present

## 2023-09-06 DIAGNOSIS — H524 Presbyopia: Secondary | ICD-10-CM | POA: Diagnosis not present

## 2023-09-06 DIAGNOSIS — H52223 Regular astigmatism, bilateral: Secondary | ICD-10-CM | POA: Diagnosis not present

## 2023-09-06 DIAGNOSIS — H2513 Age-related nuclear cataract, bilateral: Secondary | ICD-10-CM | POA: Diagnosis not present

## 2023-09-07 DIAGNOSIS — H2513 Age-related nuclear cataract, bilateral: Secondary | ICD-10-CM | POA: Diagnosis not present

## 2023-11-10 DIAGNOSIS — D7589 Other specified diseases of blood and blood-forming organs: Secondary | ICD-10-CM | POA: Diagnosis not present

## 2023-11-10 DIAGNOSIS — E7800 Pure hypercholesterolemia, unspecified: Secondary | ICD-10-CM | POA: Diagnosis not present

## 2023-11-10 DIAGNOSIS — M5416 Radiculopathy, lumbar region: Secondary | ICD-10-CM | POA: Diagnosis not present

## 2023-11-10 DIAGNOSIS — R739 Hyperglycemia, unspecified: Secondary | ICD-10-CM | POA: Diagnosis not present

## 2023-11-10 DIAGNOSIS — I10 Essential (primary) hypertension: Secondary | ICD-10-CM | POA: Diagnosis not present

## 2023-11-16 DIAGNOSIS — Z131 Encounter for screening for diabetes mellitus: Secondary | ICD-10-CM | POA: Diagnosis not present

## 2023-11-16 DIAGNOSIS — E038 Other specified hypothyroidism: Secondary | ICD-10-CM | POA: Diagnosis not present

## 2023-11-16 DIAGNOSIS — I6502 Occlusion and stenosis of left vertebral artery: Secondary | ICD-10-CM | POA: Diagnosis not present

## 2023-11-16 DIAGNOSIS — M5416 Radiculopathy, lumbar region: Secondary | ICD-10-CM | POA: Diagnosis not present

## 2023-11-16 DIAGNOSIS — E559 Vitamin D deficiency, unspecified: Secondary | ICD-10-CM | POA: Diagnosis not present

## 2023-11-16 DIAGNOSIS — E7800 Pure hypercholesterolemia, unspecified: Secondary | ICD-10-CM | POA: Diagnosis not present

## 2023-11-16 DIAGNOSIS — Z2821 Immunization not carried out because of patient refusal: Secondary | ICD-10-CM | POA: Diagnosis not present

## 2023-11-16 DIAGNOSIS — I10 Essential (primary) hypertension: Secondary | ICD-10-CM | POA: Diagnosis not present

## 2023-12-07 DIAGNOSIS — I10 Essential (primary) hypertension: Secondary | ICD-10-CM | POA: Diagnosis not present

## 2023-12-07 DIAGNOSIS — E038 Other specified hypothyroidism: Secondary | ICD-10-CM | POA: Diagnosis not present

## 2024-02-04 ENCOUNTER — Other Ambulatory Visit (INDEPENDENT_AMBULATORY_CARE_PROVIDER_SITE_OTHER): Payer: Self-pay | Admitting: Nurse Practitioner

## 2024-02-04 DIAGNOSIS — I6523 Occlusion and stenosis of bilateral carotid arteries: Secondary | ICD-10-CM

## 2024-02-08 ENCOUNTER — Ambulatory Visit (INDEPENDENT_AMBULATORY_CARE_PROVIDER_SITE_OTHER): Payer: PPO

## 2024-02-08 ENCOUNTER — Ambulatory Visit (INDEPENDENT_AMBULATORY_CARE_PROVIDER_SITE_OTHER): Payer: PPO | Admitting: Vascular Surgery

## 2024-02-08 DIAGNOSIS — I6523 Occlusion and stenosis of bilateral carotid arteries: Secondary | ICD-10-CM | POA: Diagnosis not present

## 2024-02-28 ENCOUNTER — Other Ambulatory Visit: Payer: Self-pay | Admitting: Internal Medicine

## 2024-02-28 DIAGNOSIS — Z1231 Encounter for screening mammogram for malignant neoplasm of breast: Secondary | ICD-10-CM

## 2024-03-10 DIAGNOSIS — H04123 Dry eye syndrome of bilateral lacrimal glands: Secondary | ICD-10-CM | POA: Diagnosis not present

## 2024-03-10 DIAGNOSIS — H2513 Age-related nuclear cataract, bilateral: Secondary | ICD-10-CM | POA: Diagnosis not present

## 2024-03-10 DIAGNOSIS — H43811 Vitreous degeneration, right eye: Secondary | ICD-10-CM | POA: Diagnosis not present

## 2024-03-10 DIAGNOSIS — H35371 Puckering of macula, right eye: Secondary | ICD-10-CM | POA: Diagnosis not present

## 2024-03-10 DIAGNOSIS — H2511 Age-related nuclear cataract, right eye: Secondary | ICD-10-CM | POA: Diagnosis not present

## 2024-03-13 ENCOUNTER — Ambulatory Visit
Admission: RE | Admit: 2024-03-13 | Discharge: 2024-03-13 | Disposition: A | Discharge status: Home / Self Care | Source: Ambulatory Visit | Attending: Internal Medicine | Admitting: Internal Medicine

## 2024-03-13 DIAGNOSIS — Z1231 Encounter for screening mammogram for malignant neoplasm of breast: Secondary | ICD-10-CM | POA: Diagnosis not present

## 2024-03-27 DIAGNOSIS — H43811 Vitreous degeneration, right eye: Secondary | ICD-10-CM | POA: Diagnosis not present

## 2024-03-27 DIAGNOSIS — H35371 Puckering of macula, right eye: Secondary | ICD-10-CM | POA: Diagnosis not present

## 2024-03-27 DIAGNOSIS — H04123 Dry eye syndrome of bilateral lacrimal glands: Secondary | ICD-10-CM | POA: Diagnosis not present

## 2024-03-27 DIAGNOSIS — H2513 Age-related nuclear cataract, bilateral: Secondary | ICD-10-CM | POA: Diagnosis not present

## 2024-03-27 DIAGNOSIS — H2511 Age-related nuclear cataract, right eye: Secondary | ICD-10-CM | POA: Diagnosis not present

## 2024-04-10 ENCOUNTER — Encounter: Payer: Self-pay | Admitting: Ophthalmology

## 2024-04-18 NOTE — Discharge Instructions (Signed)

## 2024-04-19 ENCOUNTER — Encounter
Admission: RE | Disposition: A | Discharge status: Home / Self Care | Payer: Self-pay | Source: Home / Self Care | Attending: Ophthalmology

## 2024-04-19 ENCOUNTER — Encounter: Payer: Self-pay | Admitting: Ophthalmology

## 2024-04-19 ENCOUNTER — Ambulatory Visit: Payer: Self-pay | Admitting: Anesthesiology

## 2024-04-19 ENCOUNTER — Ambulatory Visit
Admission: RE | Admit: 2024-04-19 | Discharge: 2024-04-19 | Disposition: A | Discharge status: Home / Self Care | Attending: Ophthalmology | Admitting: Ophthalmology

## 2024-04-19 ENCOUNTER — Other Ambulatory Visit: Payer: Self-pay

## 2024-04-19 DIAGNOSIS — H2512 Age-related nuclear cataract, left eye: Secondary | ICD-10-CM | POA: Diagnosis not present

## 2024-04-19 DIAGNOSIS — H2511 Age-related nuclear cataract, right eye: Secondary | ICD-10-CM | POA: Insufficient documentation

## 2024-04-19 DIAGNOSIS — F17200 Nicotine dependence, unspecified, uncomplicated: Secondary | ICD-10-CM | POA: Diagnosis not present

## 2024-04-19 DIAGNOSIS — I10 Essential (primary) hypertension: Secondary | ICD-10-CM | POA: Diagnosis not present

## 2024-04-19 DIAGNOSIS — F17210 Nicotine dependence, cigarettes, uncomplicated: Secondary | ICD-10-CM | POA: Diagnosis not present

## 2024-04-19 HISTORY — PX: CATARACT EXTRACTION W/PHACO: SHX586

## 2024-04-19 SURGERY — PHACOEMULSIFICATION, CATARACT, WITH IOL INSERTION
Anesthesia: Monitor Anesthesia Care | Site: Eye | Laterality: Right

## 2024-04-19 MED ORDER — MOXIFLOXACIN HCL 0.5 % OP SOLN
OPHTHALMIC | Status: DC | PRN
  Administered 2024-04-19: .2 mL via OPHTHALMIC

## 2024-04-19 MED ORDER — FENTANYL CITRATE (PF) 100 MCG/2ML IJ SOLN
INTRAMUSCULAR | Status: AC
  Filled 2024-04-19: qty 2

## 2024-04-19 MED ORDER — LIDOCAINE HCL (CARDIAC) PF 100 MG/5ML IV SOSY
PREFILLED_SYRINGE | INTRAVENOUS | Status: DC | PRN
  Administered 2024-04-19: 60 mg via INTRAVENOUS

## 2024-04-19 MED ORDER — ARMC OPHTHALMIC DILATING DROPS
1.0000 | OPHTHALMIC | Status: DC | PRN
  Administered 2024-04-19 (×3): 1 via OPHTHALMIC

## 2024-04-19 MED ORDER — TETRACAINE HCL 0.5 % OP SOLN
1.0000 [drp] | OPHTHALMIC | Status: DC | PRN
  Administered 2024-04-19 (×3): 1 [drp] via OPHTHALMIC

## 2024-04-19 MED ORDER — MIDAZOLAM HCL 2 MG/2ML IJ SOLN
INTRAMUSCULAR | Status: DC | PRN
  Administered 2024-04-19: 2 mg via INTRAVENOUS

## 2024-04-19 MED ORDER — TETRACAINE HCL 0.5 % OP SOLN
OPHTHALMIC | Status: AC
  Filled 2024-04-19: qty 4

## 2024-04-19 MED ORDER — SIGHTPATH DOSE#1 BSS IO SOLN
INTRAOCULAR | Status: DC | PRN
  Administered 2024-04-19: 59 mL via OPHTHALMIC

## 2024-04-19 MED ORDER — MIDAZOLAM HCL 2 MG/2ML IJ SOLN
INTRAMUSCULAR | Status: AC
  Filled 2024-04-19: qty 2

## 2024-04-19 MED ORDER — ARMC OPHTHALMIC DILATING DROPS
OPHTHALMIC | Status: AC
  Filled 2024-04-19: qty 0.5

## 2024-04-19 MED ORDER — FENTANYL CITRATE (PF) 100 MCG/2ML IJ SOLN
INTRAMUSCULAR | Status: DC | PRN
  Administered 2024-04-19: 50 ug via INTRAVENOUS
  Administered 2024-04-19: 25 ug via INTRAVENOUS

## 2024-04-19 MED ORDER — LIDOCAINE HCL (PF) 2 % IJ SOLN
INTRAOCULAR | Status: DC | PRN
  Administered 2024-04-19: 2 mL

## 2024-04-19 MED ORDER — BRIMONIDINE TARTRATE-TIMOLOL 0.2-0.5 % OP SOLN
OPHTHALMIC | Status: DC | PRN
  Administered 2024-04-19: 1 [drp] via OPHTHALMIC

## 2024-04-19 MED ORDER — SIGHTPATH DOSE#1 NA HYALUR & NA CHOND-NA HYALUR IO KIT
PACK | INTRAOCULAR | Status: DC | PRN
  Administered 2024-04-19: 1 via OPHTHALMIC

## 2024-04-19 MED ORDER — SODIUM CHLORIDE 0.9% FLUSH
INTRAVENOUS | Status: DC | PRN
  Administered 2024-04-19 (×2): 10 mL via INTRAVENOUS

## 2024-04-19 MED ORDER — SIGHTPATH DOSE#1 BSS IO SOLN
INTRAOCULAR | Status: DC | PRN
  Administered 2024-04-19: 15 mL via INTRAOCULAR

## 2024-04-19 SURGICAL SUPPLY — 10 items
CATARACT SUITE SIGHTPATH (MISCELLANEOUS) ×1 IMPLANT
FEE CATARACT SUITE SIGHTPATH (MISCELLANEOUS) ×1 IMPLANT
GLOVE BIOGEL PI IND STRL 8 (GLOVE) ×1 IMPLANT
GLOVE SURG LX STRL 7.5 STRW (GLOVE) ×1 IMPLANT
GLOVE SURG PROTEXIS BL SZ6.5 (GLOVE) ×1 IMPLANT
GLOVE SURG SYN 6.5 PF PI BL (GLOVE) ×1 IMPLANT
LENS IOL TECNIS EYHANCE 19.0 (Intraocular Lens) IMPLANT
NDL FILTER BLUNT 18X1 1/2 (NEEDLE) ×1 IMPLANT
NEEDLE FILTER BLUNT 18X1 1/2 (NEEDLE) ×1 IMPLANT
SYR 3ML LL SCALE MARK (SYRINGE) ×1 IMPLANT

## 2024-04-19 NOTE — H&P (Signed)
 [facility]   Primary Care Physician:  Melchor Spoon, MD Ophthalmologist: Dr. Annell Kidney  Pre-Procedure History & Physical: HPI:  Brandy Holt is a 73 y.o. female here for ophthalmic surgery.   Past Medical History:  Diagnosis Date   Hyperlipidemia    Hypertension     Past Surgical History:  Procedure Laterality Date   COLONOSCOPY WITH PROPOFOL  N/A 06/14/2019   Procedure: COLONOSCOPY WITH PROPOFOL ;  Surgeon: Toledo, Alphonsus Jeans, MD;  Location: [facility];  Service: Gastroenterology;  Laterality: N/A;   ESOPHAGOGASTRODUODENOSCOPY (EGD) WITH PROPOFOL  N/A 06/14/2019   Procedure: ESOPHAGOGASTRODUODENOSCOPY (EGD) WITH PROPOFOL ;  Surgeon: Toledo, Alphonsus Jeans, MD;  Location: [facility];  Service: Gastroenterology;  Laterality: N/A;    Prior to Admission medications   Medication Sig Start Date End Date Taking? Authorizing Provider  acetaminophen (TYLENOL) 500 MG tablet Take 500 mg by mouth every 6 (six) hours as needed.   Yes [provider]  amLODipine (NORVASC) 10 MG tablet Take 10 mg by mouth daily.   Yes [provider]  aspirin 325 MG tablet Take 325 mg by mouth daily.   Yes [provider]  lisinopril (ZESTRIL) 2.5 MG tablet Take 2.5 mg by mouth daily.   Yes [provider]  omeprazole (PRILOSEC) 20 MG capsule Take by mouth.   Yes [provider]  rosuvastatin  (CRESTOR ) 5 MG tablet Take 1 tablet (5 mg total) by mouth 3 (three) times a week. 08/30/23  Yes Brown, Fallon E, NP    Allergies as of 03/14/2024 - Review Complete 02/11/2023  Allergen Reaction Noted   Amoxicillin Rash 09/03/2020    Family History  Adopted: Yes  Problem Relation Age of Onset   Breast cancer Neg Hx     Social History   Socioeconomic History   Marital status: Divorced    Spouse name: Not on file   Number of children: Not on file   Years of education: Not on file   Highest education level: Not on file  Occupational History    Not on file  Tobacco Use   Smoking status: Every Day    Current packs/day: 1.00    Types: Cigarettes   Smokeless tobacco: Never  Vaping Use   Vaping status: Never Used  Substance and Sexual Activity   Alcohol  use: Never   Drug use: Never   Sexual activity: Not on file  Other Topics Concern   Not on file  Social History Narrative   Not on file   Social Drivers of Health   Financial Resource Strain: Low Risk  (11/16/2023)   Received from [facility]   Overall Financial Resource Strain (CARDIA)    Difficulty of Paying Living Expenses: Not hard at all  Food Insecurity: No Food Insecurity (11/16/2023)   Received from [facility]   Hunger Vital Sign    Worried About Running Out of Food in the Last Year: Never true    Ran Out of Food in the Last Year: Never true  Transportation Needs: No Transportation Needs (11/16/2023)   Received from [facility] - Transportation    In the past 12 months, has lack of transportation kept you from medical appointments or from getting medications?: No    Lack of Transportation (Non-Medical): No  Physical Activity: Not on file  Stress: Not on file  Social Connections: Not on file  Intimate Partner Violence: Not on file    Review of Systems: See HPI,  otherwise negative ROS  Physical Exam: Ht 5\' 2"  (1.575 m)   Wt 52.2 kg   BMI 21.03 kg/m  General:   Alert,  pleasant and cooperative in NAD Head:  Normocephalic and atraumatic. Lungs:  Clear to auscultation.    Heart:  Regular rate and rhythm.   Impression/Plan: Brandy Holt is here for ophthalmic surgery.  Risks, benefits, limitations, and alternatives regarding ophthalmic surgery have been reviewed with the patient.  Questions have been answered.  All parties agreeable.   Annell Kidney, MD  04/19/2024, 7:35 AM

## 2024-04-19 NOTE — Anesthesia Postprocedure Evaluation (Signed)
 Anesthesia Post Note  Patient: Brandy Holt  Procedure(s) Performed: PHACOEMULSIFICATION, CATARACT, WITH IOL INSERTION 10.18 00:46.5 (Right: Eye)  Patient location during evaluation: PACU Anesthesia Type: MAC Level of consciousness: awake and alert Pain management: pain level controlled Vital Signs Assessment: post-procedure vital signs reviewed and stable Respiratory status: spontaneous breathing, nonlabored ventilation, respiratory function stable and patient connected to nasal cannula oxygen Cardiovascular status: stable and blood pressure returned to baseline Postop Assessment: no apparent nausea or vomiting Anesthetic complications: no   No notable events documented.   Last Vitals:  Vitals:   04/19/24 0845 04/19/24 0848  BP: (!) 136/91 (!) 136/91  Pulse: 86 78  Resp: 17 20  Temp:  (!) 36.4 C  SpO2: 99% 96%    Last Pain:  Vitals:   04/19/24 0848  TempSrc:   PainSc: 0-No pain                 Khyree Carillo C Abram Sax

## 2024-04-19 NOTE — Transfer of Care (Signed)
 Immediate Anesthesia Transfer of Care Note  Patient: Brandy Holt  Procedure(s) Performed: PHACOEMULSIFICATION, CATARACT, WITH IOL INSERTION 10.18 00:46.5 (Right: Eye)  Patient Location: PACU  Anesthesia Type:MAC  Level of Consciousness: awake, alert , and oriented  Airway & Oxygen Therapy: Patient Spontanous Breathing  Post-op Assessment: Report given to RN and Post -op Vital signs reviewed and stable  Post vital signs: Reviewed and stable  Last Vitals:  Vitals Value Taken Time  BP 120/47 04/19/24 0842  Temp 36.4 C 04/19/24 0842  Pulse 75 04/19/24 0845  Resp 17 04/19/24 0845  SpO2 97 % 04/19/24 0845  Vitals shown include unfiled device data.  Last Pain:  Vitals:   04/19/24 0842  TempSrc:   PainSc: 0-No pain         Complications: No notable events documented.

## 2024-04-19 NOTE — Op Note (Signed)
 LOCATION:  [facility]   PREOPERATIVE DIAGNOSIS:    Nuclear sclerotic cataract right eye. H25.11   POSTOPERATIVE DIAGNOSIS:  Nuclear sclerotic cataract right eye.     PROCEDURE:  Phacoemusification with posterior chamber intraocular lens placement of the right eye   ULTRASOUND TIME: Procedure(s): PHACOEMULSIFICATION, CATARACT, WITH IOL INSERTION 10.18 00:46.5 (Right)  LENS:   Implant Name Type Inv. Item Serial No. Manufacturer Lot No. LRB No. Used Action  LENS IOL TECNIS EYHANCE 19.0 - [id number] Intraocular Lens LENS IOL TECNIS EYHANCE 19.0 [id number] [employer]  Right 1 Implanted         SURGEON:  Berline Brenner, MD   ANESTHESIA:  Topical with tetracaine drops and 2% Xylocaine jelly, augmented with 1% preservative-free intracameral lidocaine.    COMPLICATIONS:  None.   DESCRIPTION OF PROCEDURE:  The patient was identified in the holding room and transported to the operating room and placed in the supine position under the operating microscope.  The right eye was identified as the operative eye and it was prepped and draped in the usual sterile ophthalmic fashion.   A 1 millimeter clear-corneal paracentesis was made at the 12:00 position.  0.5 ml of preservative-free 1% lidocaine was injected into the anterior chamber. The anterior chamber was filled with Viscoat viscoelastic.  A 2.4 millimeter keratome was used to make a near-clear corneal incision at the 9:00 position.  A curvilinear capsulorrhexis was made with a cystotome and capsulorrhexis forceps.  Balanced salt solution was used to hydrodissect and hydrodelineate the nucleus.   Phacoemulsification was then used in stop and chop fashion to remove the lens nucleus and epinucleus.  The remaining cortex was then removed using the irrigation and aspiration handpiece. Provisc was then placed into the capsular bag to distend it for lens placement.  A lens was then injected into the capsular bag.  The remaining  viscoelastic was aspirated.   Wounds were hydrated with balanced salt solution.  The anterior chamber was inflated to a physiologic pressure with balanced salt solution.  No wound leaks were noted. Vigamox 0.2 ml of a 1mg  per ml solution was injected into the anterior chamber for a dose of 0.2 mg of intracameral antibiotic at the completion of the case.   Timolol and Brimonidine drops were applied to the eye.  The patient was taken to the recovery room in stable condition without complications of anesthesia or surgery.   Brandy Holt 04/19/2024, 8:41 AM

## 2024-04-19 NOTE — Anesthesia Preprocedure Evaluation (Signed)
 Anesthesia Evaluation  Patient identified by MRN, date of birth, ID band Patient awake    Reviewed: Allergy & Precautions, H&P , NPO status , Patient's Chart, lab work & pertinent test results  Airway Mallampati: III  TM Distance: >3 FB Neck ROM: Full    Dental  (+) Caps Upper front central incisors veneers or fillings:   Pulmonary neg pulmonary ROS, Current Smoker and Patient abstained from smoking.   Pulmonary exam normal breath sounds clear to auscultation       Cardiovascular hypertension, negative cardio ROS Normal cardiovascular exam Rhythm:Regular Rate:Normal     Neuro/Psych  Neuromuscular disease negative neurological ROS  negative psych ROS   GI/Hepatic negative GI ROS, Neg liver ROS,,,  Endo/Other  negative endocrine ROS    Renal/GU negative Renal ROS  negative genitourinary   Musculoskeletal negative musculoskeletal ROS (+)    Abdominal   Peds negative pediatric ROS (+)  Hematology negative hematology ROS (+)   Anesthesia Other Findings HTN   Reproductive/Obstetrics negative OB ROS                              Anesthesia Physical Anesthesia Plan  ASA: 2  Anesthesia Plan: MAC   Post-op Pain Management:    Induction: Intravenous  PONV Risk Score and Plan:   Airway Management Planned: Natural Airway and Nasal Cannula  Additional Equipment:   Intra-op Plan:   Post-operative Plan:   Informed Consent: I have reviewed the patients History and Physical, chart, labs and discussed the procedure including the risks, benefits and alternatives for the proposed anesthesia with the patient or authorized representative who has indicated his/her understanding and acceptance.     Dental Advisory Given  Plan Discussed with: Anesthesiologist, CRNA and Surgeon  Anesthesia Plan Comments: (Patient consented for risks of anesthesia including but not limited to:  - adverse  reactions to medications - damage to eyes, teeth, lips or other oral mucosa - nerve damage due to positioning  - sore throat or hoarseness - Damage to heart, brain, nerves, lungs, other parts of body or loss of life  Patient voiced understanding and assent.)         Anesthesia Quick Evaluation

## 2024-04-21 ENCOUNTER — Encounter: Payer: Self-pay | Admitting: Ophthalmology

## 2024-04-21 NOTE — Anesthesia Preprocedure Evaluation (Addendum)
 Anesthesia Evaluation  Patient identified by MRN, date of birth, ID band Patient awake    Reviewed: Allergy & Precautions, H&P , NPO status , Patient's Chart, lab work & pertinent test results  Airway Mallampati: III  TM Distance: >3 FB Neck ROM: Full    Dental no notable dental hx.  Upper front central incisors veneers or fillings: :   Pulmonary Current Smoker and Patient abstained from smoking.   Pulmonary exam normal breath sounds clear to auscultation       Cardiovascular hypertension, Normal cardiovascular exam Rhythm:Regular Rate:Normal     Neuro/Psych negative neurological ROS  negative psych ROS   GI/Hepatic negative GI ROS, Neg liver ROS,,,  Endo/Other  negative endocrine ROS    Renal/GU negative Renal ROS  negative genitourinary   Musculoskeletal negative musculoskeletal ROS (+)    Abdominal   Peds negative pediatric ROS (+)  Hematology negative hematology ROS (+)   Anesthesia Other Findings Previous cataract surgery 04-19-24  HTN hyperlipidemia  Reproductive/Obstetrics negative OB ROS                             Anesthesia Physical Anesthesia Plan  ASA: 2  Anesthesia Plan: MAC   Post-op Pain Management:    Induction: Intravenous  PONV Risk Score and Plan:   Airway Management Planned: Natural Airway and Nasal Cannula  Additional Equipment:   Intra-op Plan:   Post-operative Plan:   Informed Consent: I have reviewed the patients History and Physical, chart, labs and discussed the procedure including the risks, benefits and alternatives for the proposed anesthesia with the patient or authorized representative who has indicated his/her understanding and acceptance.     Dental Advisory Given  Plan Discussed with: Anesthesiologist, CRNA and Surgeon  Anesthesia Plan Comments: (Patient consented for risks of anesthesia including but not limited to:  - adverse  reactions to medications - damage to eyes, teeth, lips or other oral mucosa - nerve damage due to positioning  - sore throat or hoarseness - Damage to heart, brain, nerves, lungs, other parts of body or loss of life  Patient voiced understanding and assent.)        Anesthesia Quick Evaluation

## 2024-05-02 NOTE — Discharge Instructions (Signed)

## 2024-05-03 ENCOUNTER — Ambulatory Visit: Payer: Self-pay | Admitting: Anesthesiology

## 2024-05-03 ENCOUNTER — Ambulatory Visit
Admission: RE | Admit: 2024-05-03 | Discharge: 2024-05-03 | Disposition: A | Discharge status: Home / Self Care | Attending: Ophthalmology | Admitting: Ophthalmology

## 2024-05-03 ENCOUNTER — Encounter
Admission: RE | Disposition: A | Discharge status: Home / Self Care | Payer: Self-pay | Source: Home / Self Care | Attending: Ophthalmology

## 2024-05-03 ENCOUNTER — Other Ambulatory Visit: Payer: Self-pay

## 2024-05-03 ENCOUNTER — Encounter: Payer: Self-pay | Admitting: Ophthalmology

## 2024-05-03 DIAGNOSIS — F17210 Nicotine dependence, cigarettes, uncomplicated: Secondary | ICD-10-CM | POA: Diagnosis not present

## 2024-05-03 DIAGNOSIS — I10 Essential (primary) hypertension: Secondary | ICD-10-CM | POA: Insufficient documentation

## 2024-05-03 DIAGNOSIS — H2512 Age-related nuclear cataract, left eye: Secondary | ICD-10-CM | POA: Insufficient documentation

## 2024-05-03 DIAGNOSIS — F17200 Nicotine dependence, unspecified, uncomplicated: Secondary | ICD-10-CM | POA: Diagnosis not present

## 2024-05-03 HISTORY — PX: CATARACT EXTRACTION W/PHACO: SHX586

## 2024-05-03 SURGERY — PHACOEMULSIFICATION, CATARACT, WITH IOL INSERTION
Anesthesia: Monitor Anesthesia Care | Site: Eye | Laterality: Left

## 2024-05-03 MED ORDER — MIDAZOLAM HCL 2 MG/2ML IJ SOLN
INTRAMUSCULAR | Status: DC | PRN
  Administered 2024-05-03 (×2): 1 mg via INTRAVENOUS

## 2024-05-03 MED ORDER — SIGHTPATH DOSE#1 BSS IO SOLN
INTRAOCULAR | Status: DC | PRN
  Administered 2024-05-03: 15 mL via INTRAOCULAR

## 2024-05-03 MED ORDER — TETRACAINE HCL 0.5 % OP SOLN
OPHTHALMIC | Status: AC
  Filled 2024-05-03: qty 4

## 2024-05-03 MED ORDER — BRIMONIDINE TARTRATE-TIMOLOL 0.2-0.5 % OP SOLN
OPHTHALMIC | Status: DC | PRN
  Administered 2024-05-03: 1 [drp] via OPHTHALMIC

## 2024-05-03 MED ORDER — SIGHTPATH DOSE#1 NA HYALUR & NA CHOND-NA HYALUR IO KIT
PACK | INTRAOCULAR | Status: DC | PRN
  Administered 2024-05-03: 1 via OPHTHALMIC

## 2024-05-03 MED ORDER — LIDOCAINE HCL (PF) 2 % IJ SOLN
INTRAOCULAR | Status: DC | PRN
  Administered 2024-05-03: 2 mL

## 2024-05-03 MED ORDER — MOXIFLOXACIN HCL 0.5 % OP SOLN
OPHTHALMIC | Status: DC | PRN
  Administered 2024-05-03: .2 mL via OPHTHALMIC

## 2024-05-03 MED ORDER — FENTANYL CITRATE (PF) 100 MCG/2ML IJ SOLN
INTRAMUSCULAR | Status: DC | PRN
  Administered 2024-05-03 (×2): 25 ug via INTRAVENOUS
  Administered 2024-05-03: 50 ug via INTRAVENOUS

## 2024-05-03 MED ORDER — ARMC OPHTHALMIC DILATING DROPS
OPHTHALMIC | Status: AC
  Filled 2024-05-03: qty 0.5

## 2024-05-03 MED ORDER — TETRACAINE HCL 0.5 % OP SOLN
1.0000 [drp] | OPHTHALMIC | Status: DC | PRN
  Administered 2024-05-03 (×3): 1 [drp] via OPHTHALMIC

## 2024-05-03 MED ORDER — ARMC OPHTHALMIC DILATING DROPS
1.0000 | OPHTHALMIC | Status: DC | PRN
  Administered 2024-05-03 (×3): 1 via OPHTHALMIC

## 2024-05-03 MED ORDER — SIGHTPATH DOSE#1 BSS IO SOLN
INTRAOCULAR | Status: DC | PRN
  Administered 2024-05-03: 59 mL via OPHTHALMIC

## 2024-05-03 MED ORDER — MIDAZOLAM HCL 2 MG/2ML IJ SOLN
INTRAMUSCULAR | Status: AC
  Filled 2024-05-03: qty 2

## 2024-05-03 MED ORDER — FENTANYL CITRATE (PF) 100 MCG/2ML IJ SOLN
INTRAMUSCULAR | Status: AC
  Filled 2024-05-03: qty 2

## 2024-05-03 SURGICAL SUPPLY — 10 items
CATARACT SUITE SIGHTPATH (MISCELLANEOUS) ×1 IMPLANT
FEE CATARACT SUITE SIGHTPATH (MISCELLANEOUS) ×1 IMPLANT
GLOVE BIOGEL PI IND STRL 8 (GLOVE) ×1 IMPLANT
GLOVE SURG LX STRL 7.5 STRW (GLOVE) ×1 IMPLANT
GLOVE SURG PROTEXIS BL SZ6.5 (GLOVE) ×1 IMPLANT
GLOVE SURG SYN 6.5 PF PI BL (GLOVE) ×1 IMPLANT
LENS IOL TECNIS EYHANCE 19.5 (Intraocular Lens) IMPLANT
NDL FILTER BLUNT 18X1 1/2 (NEEDLE) ×1 IMPLANT
NEEDLE FILTER BLUNT 18X1 1/2 (NEEDLE) ×1 IMPLANT
SYR 3ML LL SCALE MARK (SYRINGE) ×1 IMPLANT

## 2024-05-03 NOTE — Anesthesia Postprocedure Evaluation (Signed)
 Anesthesia Post Note  Patient: SILBIA BLACKFORD  Procedure(s) Performed: PHACOEMULSIFICATION, CATARACT, WITH IOL INSERTION 8.03 00:34.4 (Left: Eye)  Patient location during evaluation: PACU Anesthesia Type: MAC Level of consciousness: awake and alert Pain management: pain level controlled Vital Signs Assessment: post-procedure vital signs reviewed and stable Respiratory status: spontaneous breathing, nonlabored ventilation, respiratory function stable and patient connected to nasal cannula oxygen Cardiovascular status: blood pressure returned to baseline and stable Postop Assessment: no apparent nausea or vomiting Anesthetic complications: no   No notable events documented.   Last Vitals:  Vitals:   05/03/24 1005 05/03/24 1010  BP: (!) 100/59 110/62  Pulse: 82 79  Resp: 15 15  Temp: (!) 36.4 C (!) 36.4 C  SpO2: 98%     Last Pain:  Vitals:   05/03/24 1010  PainSc: 0-No pain                 Kainat Pizana C Lucciano Vitali

## 2024-05-03 NOTE — Transfer of Care (Signed)
 Immediate Anesthesia Transfer of Care Note  Patient: Brandy Holt  Procedure(s) Performed: PHACOEMULSIFICATION, CATARACT, WITH IOL INSERTION 8.03 00:34.4 (Left: Eye)  Patient Location: PACU  Anesthesia Type: MAC  Level of Consciousness: awake, alert  and patient cooperative  Airway and Oxygen Therapy: Patient Spontanous Breathing and Patient connected to supplemental oxygen  Post-op Assessment: Post-op Vital signs reviewed, Patient's Cardiovascular Status Stable, Respiratory Function Stable, Patent Airway and No signs of Nausea or vomiting  Post-op Vital Signs: Reviewed and stable  Complications: No notable events documented.

## 2024-05-03 NOTE — H&P (Signed)
 [facility] ([facility]   Primary Care Physician:  Melchor Spoon, MD Ophthalmologist: Dr. Annell Kidney  Pre-Procedure History & Physical: HPI:  Brandy Holt is a 73 y.o. female here for ophthalmic surgery.   Past Medical History:  Diagnosis Date   Hyperlipidemia    Hypertension     Past Surgical History:  Procedure Laterality Date   CATARACT EXTRACTION W/PHACO Right 04/19/2024   Procedure: PHACOEMULSIFICATION, CATARACT, WITH IOL INSERTION 10.18 00:46.5;  Surgeon: Annell Kidney, MD;  Location: [facility];  Service: Ophthalmology;  Laterality: Right;   COLONOSCOPY WITH PROPOFOL  N/A 06/14/2019   Procedure: COLONOSCOPY WITH PROPOFOL ;  Surgeon: Toledo, Alphonsus Jeans, MD;  Location: [facility];  Service: Gastroenterology;  Laterality: N/A;   ESOPHAGOGASTRODUODENOSCOPY (EGD) WITH PROPOFOL  N/A 06/14/2019   Procedure: ESOPHAGOGASTRODUODENOSCOPY (EGD) WITH PROPOFOL ;  Surgeon: Toledo, Alphonsus Jeans, MD;  Location: [facility];  Service: Gastroenterology;  Laterality: N/A;    Prior to Admission medications   Medication Sig Start Date End Date Taking? Authorizing Provider  acetaminophen (TYLENOL) 500 MG tablet Take 500 mg by mouth every 6 (six) hours as needed.   Yes [provider]  amLODipine (NORVASC) 10 MG tablet Take 10 mg by mouth daily.   Yes [provider]  lisinopril (ZESTRIL) 2.5 MG tablet Take 2.5 mg by mouth daily.   Yes [provider]  omeprazole (PRILOSEC) 20 MG capsule Take by mouth.   Yes [provider]  rosuvastatin  (CRESTOR ) 5 MG tablet Take 1 tablet (5 mg total) by mouth 3 (three) times a week. 08/30/23  Yes Brown, Fallon E, NP  aspirin 325 MG tablet Take 325 mg by mouth daily.    [provider]    Allergies as of 03/14/2024 - Review Complete 02/11/2023  Allergen Reaction Noted   Amoxicillin Rash 09/03/2020    Family History  Adopted: Yes  Problem Relation Age of Onset   Breast cancer Neg Hx      Social History   Socioeconomic History   Marital status: Divorced    Spouse name: Not on file   Number of children: Not on file   Years of education: Not on file   Highest education level: Not on file  Occupational History   Not on file  Tobacco Use   Smoking status: Every Day    Current packs/day: 1.00    Types: Cigarettes   Smokeless tobacco: Never  Vaping Use   Vaping status: Never Used  Substance and Sexual Activity   Alcohol  use: Never   Drug use: Never   Sexual activity: Not on file  Other Topics Concern   Not on file  Social History Narrative   Not on file   Social Drivers of Health   Financial Resource Strain: Low Risk  (11/16/2023)   Received from [facility]   Overall Financial Resource Strain (CARDIA)    Difficulty of Paying Living Expenses: Not hard at all  Food Insecurity: No Food Insecurity (11/16/2023)   Received from [facility]   Hunger Vital Sign    Worried About Running Out of Food in the Last Year: Never true    Ran Out of Food in the Last Year: Never true  Transportation Needs: No Transportation Needs (11/16/2023)   Received from [facility]    In the past 12 months, has lack of transportation kept you from medical appointments or from getting medications?: No    Lack of Transportation (Non-Medical):  No  Physical Activity: Not on file  Stress: Not on file  Social Connections: Not on file  Intimate Partner Violence: Not on file    Review of Systems: See HPI, otherwise negative ROS  Physical Exam: BP 134/74   Pulse 85   Temp (!) 97.5 F (36.4 C)   Ht 5' 2.01" (1.575 m)   Wt 43.5 kg   SpO2 97%   BMI 17.55 kg/m  General:   Alert,  pleasant and cooperative in NAD Head:  Normocephalic and atraumatic. Lungs:  Clear to auscultation.    Heart:  Regular rate and rhythm.   Impression/Plan: Brandy Holt is here for ophthalmic surgery.  Risks,  benefits, limitations, and alternatives regarding ophthalmic surgery have been reviewed with the patient.  Questions have been answered.  All parties agreeable.   Annell Kidney, MD  05/03/2024, 8:59 AM

## 2024-05-03 NOTE — Op Note (Signed)
 OPERATIVE NOTE  AZION JALBERT [id number] 05/03/2024   PREOPERATIVE DIAGNOSIS:  Nuclear sclerotic cataract left eye. H25.12   POSTOPERATIVE DIAGNOSIS:    Nuclear sclerotic cataract left eye.     PROCEDURE:  Phacoemusification with posterior chamber intraocular lens placement of the left eye  Ultrasound time: Procedure(s): PHACOEMULSIFICATION, CATARACT, WITH IOL INSERTION 8.03 00:34.4 (Left)  LENS:   Implant Name Type Inv. Item Serial No. Manufacturer Lot No. LRB No. Used Action  LENS IOL TECNIS EYHANCE 19.5 - [id number] Intraocular Lens LENS IOL TECNIS EYHANCE 19.5 [id number] [employer]  Left 1 Implanted      SURGEON:  Berline Brenner, MD   ANESTHESIA:  Topical with tetracaine  drops and 2% Xylocaine  jelly, augmented with 1% preservative-free intracameral lidocaine .    COMPLICATIONS:  None.   DESCRIPTION OF PROCEDURE:  The patient was identified in the holding room and transported to the operating room and placed in the supine position under the operating microscope.  The left eye was identified as the operative eye and it was prepped and draped in the usual sterile ophthalmic fashion.   A 1 millimeter clear-corneal paracentesis was made at the 1:30 position.  0.5 ml of preservative-free 1% lidocaine  was injected into the anterior chamber.  The anterior chamber was filled with Viscoat viscoelastic.  A 2.4 millimeter keratome was used to make a near-clear corneal incision at the 10:30 position.  .  A curvilinear capsulorrhexis was made with a cystotome and capsulorrhexis forceps.  Balanced salt  solution was used to hydrodissect and hydrodelineate the nucleus.   Phacoemulsification was then used in stop and chop fashion to remove the lens nucleus and epinucleus.  The remaining cortex was then removed using the irrigation and aspiration handpiece. Provisc was then placed into the capsular bag to distend it for lens placement.  A lens was then injected into the capsular bag.  The  remaining viscoelastic was aspirated.   Wounds were hydrated with balanced salt  solution.  The anterior chamber was inflated to a physiologic pressure with balanced salt  solution.  No wound leaks were noted. Vigamox  0.2 ml of a 1mg  per ml solution was injected into the anterior chamber for a dose of 0.2 mg of intracameral antibiotic at the completion of the case.   Timolol  and Brimonidine  drops were applied to the eye.  The patient was taken to the recovery room in stable condition without complications of anesthesia or surgery.  Waleska Buttery 05/03/2024, 10:03 AM

## 2024-05-04 ENCOUNTER — Encounter: Payer: Self-pay | Admitting: Ophthalmology

## 2024-05-09 DIAGNOSIS — I10 Essential (primary) hypertension: Secondary | ICD-10-CM | POA: Diagnosis not present

## 2024-05-09 DIAGNOSIS — E7800 Pure hypercholesterolemia, unspecified: Secondary | ICD-10-CM | POA: Diagnosis not present

## 2024-05-09 DIAGNOSIS — Z131 Encounter for screening for diabetes mellitus: Secondary | ICD-10-CM | POA: Diagnosis not present

## 2024-05-09 DIAGNOSIS — E559 Vitamin D deficiency, unspecified: Secondary | ICD-10-CM | POA: Diagnosis not present

## 2024-05-09 DIAGNOSIS — E038 Other specified hypothyroidism: Secondary | ICD-10-CM | POA: Diagnosis not present

## 2024-05-16 DIAGNOSIS — Z0000 Encounter for general adult medical examination without abnormal findings: Secondary | ICD-10-CM | POA: Diagnosis not present

## 2024-05-16 DIAGNOSIS — E559 Vitamin D deficiency, unspecified: Secondary | ICD-10-CM | POA: Diagnosis not present

## 2024-05-16 DIAGNOSIS — Z66 Do not resuscitate: Secondary | ICD-10-CM | POA: Diagnosis not present

## 2024-05-16 DIAGNOSIS — Z1331 Encounter for screening for depression: Secondary | ICD-10-CM | POA: Diagnosis not present

## 2024-05-16 DIAGNOSIS — I6502 Occlusion and stenosis of left vertebral artery: Secondary | ICD-10-CM | POA: Diagnosis not present

## 2024-05-16 DIAGNOSIS — F17210 Nicotine dependence, cigarettes, uncomplicated: Secondary | ICD-10-CM | POA: Diagnosis not present

## 2024-05-16 DIAGNOSIS — E7800 Pure hypercholesterolemia, unspecified: Secondary | ICD-10-CM | POA: Diagnosis not present

## 2024-05-16 DIAGNOSIS — I10 Essential (primary) hypertension: Secondary | ICD-10-CM | POA: Diagnosis not present

## 2024-05-16 DIAGNOSIS — R7303 Prediabetes: Secondary | ICD-10-CM | POA: Diagnosis not present

## 2024-05-16 DIAGNOSIS — M5416 Radiculopathy, lumbar region: Secondary | ICD-10-CM | POA: Diagnosis not present

## 2024-05-23 DIAGNOSIS — Z9841 Cataract extraction status, right eye: Secondary | ICD-10-CM | POA: Diagnosis not present

## 2024-11-08 DIAGNOSIS — M5416 Radiculopathy, lumbar region: Secondary | ICD-10-CM | POA: Diagnosis not present

## 2024-11-08 DIAGNOSIS — R7303 Prediabetes: Secondary | ICD-10-CM | POA: Diagnosis not present

## 2024-11-08 DIAGNOSIS — I10 Essential (primary) hypertension: Secondary | ICD-10-CM | POA: Diagnosis not present

## 2024-11-08 DIAGNOSIS — E7800 Pure hypercholesterolemia, unspecified: Secondary | ICD-10-CM | POA: Diagnosis not present

## 2024-11-15 DIAGNOSIS — I6502 Occlusion and stenosis of left vertebral artery: Secondary | ICD-10-CM | POA: Diagnosis not present

## 2024-11-15 DIAGNOSIS — E559 Vitamin D deficiency, unspecified: Secondary | ICD-10-CM | POA: Diagnosis not present

## 2024-11-15 DIAGNOSIS — I10 Essential (primary) hypertension: Secondary | ICD-10-CM | POA: Diagnosis not present

## 2024-11-15 DIAGNOSIS — R7303 Prediabetes: Secondary | ICD-10-CM | POA: Diagnosis not present

## 2024-11-15 DIAGNOSIS — Z2821 Immunization not carried out because of patient refusal: Secondary | ICD-10-CM | POA: Diagnosis not present

## 2024-11-15 DIAGNOSIS — E7800 Pure hypercholesterolemia, unspecified: Secondary | ICD-10-CM | POA: Diagnosis not present

## 2024-11-15 DIAGNOSIS — M5416 Radiculopathy, lumbar region: Secondary | ICD-10-CM | POA: Diagnosis not present
